# Patient Record
Sex: Female | Born: 2000 | Hispanic: No | Marital: Single | Smoking: Never smoker
Health system: Southern US, Community
[De-identification: ages and names within clinical notes are randomized; demographics above are authoritative.]

## PROBLEM LIST (undated history)

## (undated) DIAGNOSIS — Z789 Other specified health status: Secondary | ICD-10-CM

## (undated) HISTORY — DX: Other specified health status: Z78.9

## (undated) HISTORY — PX: NO PAST SURGERIES: SHX2092

## (undated) HISTORY — PX: OVARIAN CYST SURGERY: SHX726

---

## 2020-09-15 ENCOUNTER — Ambulatory Visit (INDEPENDENT_AMBULATORY_CARE_PROVIDER_SITE_OTHER): Payer: Self-pay

## 2020-09-15 ENCOUNTER — Other Ambulatory Visit: Payer: Self-pay

## 2020-09-15 ENCOUNTER — Ambulatory Visit (HOSPITAL_COMMUNITY)
Admission: EM | Admit: 2020-09-15 | Discharge: 2020-09-15 | Disposition: A | Discharge status: Home / Self Care | Payer: Self-pay | Attending: Internal Medicine | Admitting: Internal Medicine

## 2020-09-15 ENCOUNTER — Encounter (HOSPITAL_COMMUNITY): Payer: Self-pay

## 2020-09-15 ENCOUNTER — Telehealth (HOSPITAL_COMMUNITY): Payer: Self-pay | Admitting: Emergency Medicine

## 2020-09-15 DIAGNOSIS — R059 Cough, unspecified: Secondary | ICD-10-CM

## 2020-09-15 DIAGNOSIS — B349 Viral infection, unspecified: Secondary | ICD-10-CM

## 2020-09-15 DIAGNOSIS — R0602 Shortness of breath: Secondary | ICD-10-CM

## 2020-09-15 DIAGNOSIS — U071 COVID-19: Secondary | ICD-10-CM | POA: Insufficient documentation

## 2020-09-15 DIAGNOSIS — R112 Nausea with vomiting, unspecified: Secondary | ICD-10-CM | POA: Insufficient documentation

## 2020-09-15 LAB — SARS CORONAVIRUS 2 (TAT 6-24 HRS): SARS Coronavirus 2: POSITIVE — AB

## 2020-09-15 MED ORDER — ACETAMINOPHEN 325 MG PO TABS
650.0000 mg | ORAL_TABLET | Freq: Once | ORAL | Status: AC
  Administered 2020-09-15: 650 mg via ORAL

## 2020-09-15 MED ORDER — ONDANSETRON 8 MG PO TBDP: 8.0000 mg | ORAL_TABLET | Freq: Three times a day (TID) | ORAL | 0 refills | Status: DC | PRN

## 2020-09-15 MED ORDER — ACETAMINOPHEN 325 MG PO TABS
ORAL_TABLET | ORAL | Status: AC
  Filled 2020-09-15: qty 2

## 2020-09-15 NOTE — Discharge Instructions (Addendum)
Take medications as prescribed Follow up with PCP if no improvement in 5 to 7 days or return with new or worsening symptoms. Drink plenty of water.

## 2020-09-15 NOTE — ED Triage Notes (Signed)
Pt presents with c/o vomiting x 1 week. Pt states she fainted this morning and states she has been drinking water. Pt states she has body aches x 1 week. Pt states she has mid and lower back pain.  Denies abdominal pain.

## 2020-09-15 NOTE — ED Provider Notes (Signed)
[facility]    CSN: [id number] Arrival date & time: 09/15/20  1359      History   Chief Complaint Chief Complaint  Patient presents with   Fever   Emesis   Near Syncope   Generalized Body Aches    HPI Eileen Johnson is a 20 y.o. female.   Patient here c/w vomiting x 1 week.  Reports 2 episodes in last 48 hours.  Admits f/c, fatigue, nasal congestion, rhinorrhe, cough, denies diarrhea, abdominal pain.  Vomit is non-bilious, non-bloody   History reviewed. No pertinent past medical history.  There are no problems to display for this patient.   History reviewed. No pertinent surgical history.  OB History   No obstetric history on file.      Home Medications    Prior to Admission medications   Medication Sig Start Date End Date Taking? Authorizing Provider  ondansetron (ZOFRAN-ODT) 8 MG disintegrating tablet Take 1 tablet (8 mg total) by mouth every 8 (eight) hours as needed for nausea or vomiting. 09/15/20  Yes Evern Core, PA-C    Family History Family History  Problem Relation Age of Onset   Healthy Mother    Diabetes Father     Social History Social History   Tobacco Use   Smoking status: Never    Passive exposure: Never   Smokeless tobacco: Never  Vaping Use   Vaping Use: Never used  Substance Use Topics   Alcohol use: Never   Drug use: Never     Allergies   Patient has no known allergies.   Review of Systems Review of Systems  Constitutional:  Positive for chills, fatigue and fever.  HENT:  Positive for congestion and rhinorrhea. Negative for ear pain, nosebleeds, postnasal drip, sinus pressure, sinus pain and sore throat.   Eyes:  Negative for pain and redness.  Respiratory:  Positive for cough. Negative for shortness of breath and wheezing.   Gastrointestinal:  Positive for nausea and vomiting. Negative for abdominal pain and diarrhea.  Musculoskeletal:  Negative for arthralgias and myalgias.  Skin:  Negative for  rash.  Neurological:  Positive for light-headedness. Negative for dizziness and headaches.  Hematological:  Negative for adenopathy. Does not bruise/bleed easily.  Psychiatric/Behavioral:  Negative for confusion and sleep disturbance.     Physical Exam Triage Vital Signs ED Triage Vitals  Enc Vitals Group     BP 09/15/20 1447 109/74     Pulse Rate 09/15/20 1447 (!) 115     Resp 09/15/20 1447 19     Temp 09/15/20 1451 (!) 101 F (38.3 C)     Temp Source 09/15/20 1451 Oral     SpO2 09/15/20 1447 96 %     Weight --      Height --      Head Circumference --      Peak Flow --      Pain Score --      Pain Loc --      Pain Edu? --      Excl. in GC? --    No data found.  Updated Vital Signs BP 109/74 (BP Location: Right Arm)   Pulse (!) 115   Temp (!) 101 F (38.3 C) (Oral)   Resp 19   LMP  (LMP Unknown)   SpO2 96%   Visual Acuity Right Eye Distance:   Left Eye Distance:   Bilateral Distance:    Right Eye Near:   Left Eye Near:    Bilateral  Near:     Physical Exam Vitals and nursing note reviewed.  Constitutional:      General: She is not in acute distress.    Appearance: Normal appearance. She is not ill-appearing.  HENT:     Head: Normocephalic and atraumatic.     Mouth/Throat:     Mouth: Mucous membranes are dry.     Pharynx: Oropharynx is clear. No pharyngeal swelling or posterior oropharyngeal erythema.     Tonsils: No tonsillar exudate or tonsillar abscesses.  Eyes:     General: No scleral icterus.    Extraocular Movements: Extraocular movements intact.     Conjunctiva/sclera: Conjunctivae normal.  Cardiovascular:     Rate and Rhythm: Regular rhythm.     Heart sounds: No murmur heard. Pulmonary:     Effort: Pulmonary effort is normal. No respiratory distress.     Breath sounds: Normal breath sounds. No wheezing or rales.  Abdominal:     General: There is no distension.     Tenderness: There is no abdominal tenderness. There is no right CVA  tenderness, left CVA tenderness or guarding.  Musculoskeletal:     Cervical back: Normal range of motion. No rigidity.  Skin:    Coloration: Skin is not jaundiced.     Findings: No rash.  Neurological:     General: No focal deficit present.     Mental Status: She is alert and oriented to person, place, and time.     Motor: No weakness.     Gait: Gait normal.  Psychiatric:        Mood and Affect: Mood normal.        Behavior: Behavior normal.     UC Treatments / Results  Labs (all labs ordered are listed, but only abnormal results are displayed) Labs Reviewed  SARS CORONAVIRUS 2 (TAT 6-24 HRS)    EKG   Radiology DG Chest 2 View  Result Date: 09/15/2020 CLINICAL DATA:  Shortness of breath and cough EXAM: CHEST - 2 VIEW COMPARISON:  None. FINDINGS: The heart size and mediastinal contours are within normal limits. Both lungs are clear. The visualized skeletal structures are unremarkable. IMPRESSION: No active cardiopulmonary disease. Electronically Signed   By: Alcide Clever M.D.   On: 09/15/2020 16:07    Procedures Procedures (including critical care time)  Medications Ordered in UC Medications  acetaminophen (TYLENOL) tablet 650 mg (650 mg Oral Given 09/15/20 1454)    Initial Impression / Assessment and Plan / UC Course  I have reviewed the triage vital signs and the nursing notes.  Pertinent labs & imaging results that were available during my care of the patient were reviewed by me and considered in my medical decision making (see chart for details).     Declined IV hydration Drink plenty of liquids Strict ED precautions provided Take medication as prescribed Final Clinical Impressions(s) / UC Diagnoses   Final diagnoses:  Non-intractable vomiting with nausea, unspecified vomiting type  Viral illness     Discharge Instructions      Take medications as prescribed Follow up with PCP if no improvement in 5 to 7 days or return with new or worsening  symptoms. Drink plenty of water.       ED Prescriptions     Medication Sig Dispense Auth. Provider   ondansetron (ZOFRAN-ODT) 8 MG disintegrating tablet Take 1 tablet (8 mg total) by mouth every 8 (eight) hours as needed for nausea or vomiting. 20 tablet Evern Core, PA-C  PDMP not reviewed this encounter.   Evern Core, PA-C 09/15/20 1642

## 2021-05-08 ENCOUNTER — Other Ambulatory Visit: Payer: Self-pay

## 2021-05-08 ENCOUNTER — Encounter (HOSPITAL_COMMUNITY): Payer: Self-pay | Admitting: *Deleted

## 2021-05-08 ENCOUNTER — Encounter (HOSPITAL_COMMUNITY): Payer: Self-pay | Admitting: Emergency Medicine

## 2021-05-08 ENCOUNTER — Emergency Department (HOSPITAL_COMMUNITY)
Admission: EM | Admit: 2021-05-08 | Discharge: 2021-05-08 | Disposition: A | Discharge status: Home / Self Care | Payer: Self-pay | Attending: Emergency Medicine | Admitting: Emergency Medicine

## 2021-05-08 ENCOUNTER — Ambulatory Visit (HOSPITAL_COMMUNITY)
Admission: EM | Admit: 2021-05-08 | Discharge: 2021-05-08 | Disposition: A | Discharge status: Home / Self Care | Payer: Self-pay

## 2021-05-08 ENCOUNTER — Emergency Department (HOSPITAL_COMMUNITY): Payer: Self-pay

## 2021-05-08 DIAGNOSIS — R059 Cough, unspecified: Secondary | ICD-10-CM | POA: Insufficient documentation

## 2021-05-08 DIAGNOSIS — Z5321 Procedure and treatment not carried out due to patient leaving prior to being seen by health care provider: Secondary | ICD-10-CM | POA: Insufficient documentation

## 2021-05-08 DIAGNOSIS — J069 Acute upper respiratory infection, unspecified: Secondary | ICD-10-CM

## 2021-05-08 DIAGNOSIS — M791 Myalgia, unspecified site: Secondary | ICD-10-CM | POA: Insufficient documentation

## 2021-05-08 DIAGNOSIS — Z20822 Contact with and (suspected) exposure to covid-19: Secondary | ICD-10-CM | POA: Insufficient documentation

## 2021-05-08 LAB — RESP PANEL BY RT-PCR (FLU A&B, COVID) ARPGX2
Influenza A by PCR: NEGATIVE
Influenza B by PCR: NEGATIVE
SARS Coronavirus 2 by RT PCR: NEGATIVE

## 2021-05-08 NOTE — ED Provider Triage Note (Signed)
Emergency Medicine Provider Triage Evaluation Note ? ?Eileen Johnson , a 21 y.o. female  was evaluated in triage.  Pt complains of difficulty breathing, cough, and bodyaches.  Denies fever or sick contacts. ? ?Review of Systems  ?Positive: Cough, bodyaches ?Negative: fever ? ?Physical Exam  ?BP 103/82   Pulse (!) 102   Temp 98.3 ?F (36.8 ?C)   Resp (!) 22   SpO2 99%  ?Gen:   Awake, no distress   ?Resp:  Normal effort  ?MSK:   Moves extremities without difficulty  ?Other:  Tearful, appears anxious ? ?Medical Decision Making  ?Medically screening exam initiated at 12:33 AM.  Appropriate orders placed.  Eileen Johnson was informed that the remainder of the evaluation will be completed by another provider, this initial triage assessment does not replace that evaluation, and the importance of remaining in the ED until their evaluation is complete. ? ?Cough, bodyaches difficulty breathing x1 week.  CXR, covid screen. ?  ?Garlon Hatchet, PA-C ?05/08/21 6834 ? ?

## 2021-05-08 NOTE — ED Triage Notes (Signed)
Reports cough, congestion for one week. Patient has had nausea, dizziness, body aches and sob.   ?

## 2021-05-08 NOTE — ED Notes (Signed)
Patient left on own accord °

## 2021-05-08 NOTE — ED Triage Notes (Signed)
The pt speaks little english  shes spanish speakinh she has body aches  and a cough for one week  lmp march 14th ?

## 2021-05-08 NOTE — Discharge Instructions (Addendum)
Your symptoms and exam findings are most consistent with a viral upper respiratory infection. These usually run their course in about 10 days.  If your symptoms last longer than 10 days without improvement, please follow up with your primary care provider.  If your symptoms, worsen, please go to the Emergency Room.   ? ?Your tests for COVID-19 and influenza today in the ED were negative. ? ?Some things that can make you feel better are: ?- Increased rest ?- Increasing fluid with water/sugar free electrolytes ?- Acetaminophen and ibuprofen as needed for fever/pain.  ?- Salt water gargling, chloraseptic spray and throat lozenges ?- OTC guaifenesin (Mucinex).  ?- Saline sinus flushes or a neti pot.  ?- Humidifying the air.  ?

## 2021-05-08 NOTE — ED Provider Notes (Signed)
?[facility] ? ? ? ?CSN: [id number] ?Arrival date & time: 05/08/21  1427 ? ? ?  ? ?History   ?Chief Complaint ?Chief Complaint  ?Patient presents with  ? Cough  ? ? ?HPI ?Eileen Johnson is a 21 y.o. female.  ? ?Patient presents with Spanish medical interpreter, [facility], [facility], [facility].  Patient reports feeling bad for the past week, since last Saturday.  She reports body aches, cough, congestion, runny nose, sore throat, headache, sinus pressure.  She denies nausea/vomiting, change in appetite, diarrhea, post nasal drainage, and sneezing/itchy nose. She has not taken anything for her symptoms.  ? ? ?She was seen in the Emergency Room today where a rapid COVID and influenza test were done that were negative.  ? ? ?History reviewed. No pertinent past medical history. ? ?There are no problems to display for this patient. ? ? ?History reviewed. No pertinent surgical history. ? ?OB History   ?No obstetric history on file. ?  ? ? ? ?Home Medications   ? ?Prior to Admission medications   ?Not on File  ? ? ?Family History ?Family History  ?Problem Relation Age of Onset  ? Healthy Mother   ? Diabetes Father   ? ? ?Social History ?Social History  ? ?Tobacco Use  ? Smoking status: Never  ?  Passive exposure: Never  ? Smokeless tobacco: Never  ?Vaping Use  ? Vaping Use: Never used  ?Substance Use Topics  ? Alcohol use: Never  ? Drug use: Never  ? ? ? ?Allergies   ?Patient has no known allergies. ? ? ?Review of Systems ?Review of Systems ?Per HPI ? ?Physical Exam ?Triage Vital Signs ?ED Triage Vitals  ?Enc Vitals Group  ?   BP 05/08/21 1628 119/70  ?   Pulse Rate 05/08/21 1628 77  ?   Resp 05/08/21 1628 18  ?   Temp 05/08/21 1628 98.8 ?F (37.1 ?C)  ?   Temp Source 05/08/21 1628 Oral  ?   SpO2 05/08/21 1628 100 %  ?   Weight --   ?   Height --   ?   Head Circumference --   ?   Peak Flow --   ?   Pain Score 05/08/21 1624 7  ?   Pain Loc --   ?   Pain Edu? --   ?   Excl. in GC? --   ? ?No data found. ? ?Updated Vital Signs ?BP 119/70  (BP Location: Right Arm)   Pulse 77   Temp 98.8 ?F (37.1 ?C) (Oral)   Resp 18   LMP 03/30/2021 (Approximate)   SpO2 100%  ? ?Visual Acuity ?Right Eye Distance:   ?Left Eye Distance:   ?Bilateral Distance:   ? ?Right Eye Near:   ?Left Eye Near:    ?Bilateral Near:    ? ?Physical Exam ?Vitals and nursing note reviewed.  ?Constitutional:   ?   General: She is not in acute distress. ?   Appearance: She is well-developed. She is not ill-appearing or toxic-appearing.  ?HENT:  ?   Head: Normocephalic and atraumatic.  ?   Right Ear: Tympanic membrane and ear canal normal. No drainage or swelling. No middle ear effusion. Tympanic membrane is not erythematous.  ?   Left Ear: Tympanic membrane and ear canal normal. No drainage, swelling or tenderness.  No middle ear effusion. Tympanic membrane is not erythematous.  ?   Nose: No congestion or rhinorrhea.  ?   Mouth/Throat:  ?  Mouth: Mucous membranes are moist.  ?   Pharynx: Oropharynx is clear. Uvula midline. No oropharyngeal exudate or posterior oropharyngeal erythema.  ?   Tonsils: Tonsillar exudate present. 2+ on the right. 2+ on the left.  ?Eyes:  ?   Extraocular Movements:  ?   Right eye: Normal extraocular motion.  ?   Left eye: Normal extraocular motion.  ?Cardiovascular:  ?   Rate and Rhythm: Normal rate and regular rhythm.  ?Pulmonary:  ?   Effort: Pulmonary effort is normal. No respiratory distress.  ?   Breath sounds: Normal breath sounds. No wheezing, rhonchi or rales.  ?Abdominal:  ?   General: Abdomen is flat. Bowel sounds are normal. There is no distension.  ?   Palpations: Abdomen is soft.  ?Musculoskeletal:  ?   Cervical back: Normal range of motion and neck supple.  ?Lymphadenopathy:  ?   Cervical: No cervical adenopathy.  ?Skin: ?   General: Skin is warm and dry.  ?   Capillary Refill: Capillary refill takes less than 2 seconds.  ?   Coloration: Skin is not pale.  ?   Findings: No erythema or rash.  ?Neurological:  ?   Mental Status: She is alert and  oriented to person, place, and time.  ?Psychiatric:     ?   Behavior: Behavior is cooperative.  ? ? ? ?UC Treatments / Results  ?Labs ?(all labs ordered are listed, but only abnormal results are displayed) ?Labs Reviewed - No data to display ? ?EKG ? ? ?Radiology ?DG Chest 2 View ? ?Result Date: 05/08/2021 ?CLINICAL DATA:  Cough, body aches EXAM: CHEST - 2 VIEW COMPARISON:  09/15/2020 FINDINGS: The heart size and mediastinal contours are within normal limits. Both lungs are clear. The visualized skeletal structures are unremarkable. IMPRESSION: Normal study Electronically Signed   By: Charlett NoseKevin  Dover M.D.   On: 05/08/2021 01:22   ? ?Procedures ?Procedures (including critical care time) ? ?Medications Ordered in UC ?Medications - No data to display ? ?Initial Impression / Assessment and Plan / UC Course  ?I have reviewed the triage vital signs and the nursing notes. ? ?Pertinent labs & imaging results that were available during my care of the patient were reviewed by me and considered in my medical decision making (see chart for details). ? ?Reassured patient that symptoms and exam findings are most consistent with a viral upper respiratory infection and explained lack of efficacy of antibiotics against viruses.  Discussed expected course and features suggestive of secondary bacterial infection.  On examination, she does not appear ill and I do not hear her coughing at all.  COVID and influenza testing were negative in the ED today-I do not see a reason to repeat these test. ? ?Continue supportive care. Increase fluid intake with water or electrolyte solution like pedialyte. Encouraged acetaminophen as needed for fever/pain. Encouraged salt water gargling, chloraseptic spray and throat lozenges. Encouraged OTC guaifenesin. Encouraged saline sinus flushes and/or neti with humidified air.  Seek care if symptoms persist more than 10 days without improvement. Note given for work. ? ? ?Final Clinical Impressions(s) / UC  Diagnoses  ? ?Final diagnoses:  ?Viral URI with cough  ? ? ? ?Discharge Instructions   ? ?  ?Your symptoms and exam findings are most consistent with a viral upper respiratory infection. These usually run their course in about 10 days.  If your symptoms last longer than 10 days without improvement, please follow up with your primary care provider.  If  your symptoms, worsen, please go to the Emergency Room.   ? ?Your tests for COVID-19 and influenza today in the ED were negative. ? ?Some things that can make you feel better are: ?- Increased rest ?- Increasing fluid with water/sugar free electrolytes ?- Acetaminophen and ibuprofen as needed for fever/pain.  ?- Salt water gargling, chloraseptic spray and throat lozenges ?- OTC guaifenesin (Mucinex).  ?- Saline sinus flushes or a neti pot.  ?- Humidifying the air.  ? ? ? ? ?ED Prescriptions   ?None ?  ? ?PDMP not reviewed this encounter. ?  ?Valentino Nose, NP ?05/08/21 1654 ? ?

## 2021-08-25 ENCOUNTER — Encounter (HOSPITAL_COMMUNITY): Payer: Self-pay | Admitting: Obstetrics and Gynecology

## 2021-08-25 ENCOUNTER — Inpatient Hospital Stay (HOSPITAL_COMMUNITY): Payer: Medicaid Other

## 2021-08-25 ENCOUNTER — Ambulatory Visit
Admission: EM | Admit: 2021-08-25 | Discharge: 2021-08-25 | Payer: Medicaid Other | Attending: Internal Medicine | Admitting: Internal Medicine

## 2021-08-25 ENCOUNTER — Inpatient Hospital Stay (HOSPITAL_COMMUNITY)
Admission: AD | Admit: 2021-08-25 | Discharge: 2021-08-25 | Disposition: A | Discharge status: Home / Self Care | Payer: Medicaid Other | Attending: Obstetrics and Gynecology | Admitting: Obstetrics and Gynecology

## 2021-08-25 DIAGNOSIS — Z6790 Unspecified blood type, Rh positive: Secondary | ICD-10-CM | POA: Diagnosis not present

## 2021-08-25 DIAGNOSIS — Z3201 Encounter for pregnancy test, result positive: Secondary | ICD-10-CM | POA: Diagnosis not present

## 2021-08-25 DIAGNOSIS — Z3491 Encounter for supervision of normal pregnancy, unspecified, first trimester: Secondary | ICD-10-CM

## 2021-08-25 DIAGNOSIS — N939 Abnormal uterine and vaginal bleeding, unspecified: Secondary | ICD-10-CM

## 2021-08-25 DIAGNOSIS — O209 Hemorrhage in early pregnancy, unspecified: Secondary | ICD-10-CM | POA: Insufficient documentation

## 2021-08-25 DIAGNOSIS — Z3A09 9 weeks gestation of pregnancy: Secondary | ICD-10-CM | POA: Diagnosis not present

## 2021-08-25 LAB — CBC
HCT: 38 % (ref 36.0–46.0)
Hemoglobin: 12.9 g/dL (ref 12.0–15.0)
MCH: 30.5 pg (ref 26.0–34.0)
MCHC: 33.9 g/dL (ref 30.0–36.0)
MCV: 89.8 fL (ref 80.0–100.0)
Platelets: 274 10*3/uL (ref 150–400)
RBC: 4.23 MIL/uL (ref 3.87–5.11)
RDW: 12.5 % (ref 11.5–15.5)
WBC: 14 10*3/uL — ABNORMAL HIGH (ref 4.0–10.5)
nRBC: 0 % (ref 0.0–0.2)

## 2021-08-25 LAB — WET PREP, GENITAL
Sperm: NONE SEEN
Trich, Wet Prep: NONE SEEN
WBC, Wet Prep HPF POC: <10 (ref <10)
Yeast Wet Prep HPF POC: NONE SEEN

## 2021-08-25 LAB — POCT URINE PREGNANCY: Preg Test, Ur: POSITIVE — AB

## 2021-08-25 LAB — ABO/RH: ABO/RH(D): O POS

## 2021-08-25 LAB — HCG, QUANTITATIVE, PREGNANCY: hCG, Beta Chain, Quant, S: 93057 m[IU]/mL — ABNORMAL HIGH (ref <5)

## 2021-08-25 NOTE — ED Provider Notes (Signed)
[facility]    CSN: [id number] Arrival date & time: 08/25/21  1629      History   Chief Complaint Chief Complaint  Patient presents with   Possible Pregnancy   Vaginal Bleeding    HPI Eileen Johnson is a 21 y.o. female.   Patient presents with vaginal bleeding that started around 3 PM today.  Patient reports that she is currently pregnant.  Last menstrual cycle was 05/24/2021.  She has been seen by OB/GYN and has not had any complications until today with pregnancy.  Patient reports heavy vaginal bleeding.  She has had to change her menstrual pad twice since bleeding started.  Denies any associated abdominal pain.   Possible Pregnancy  Vaginal Bleeding   History reviewed. No pertinent past medical history.  There are no problems to display for this patient.   History reviewed. No pertinent surgical history.  OB History     Gravida  1   Para      Term      Preterm      AB      Living         SAB      IAB      Ectopic      Multiple      Live Births               Home Medications    Prior to Admission medications   Not on File    Family History Family History  Problem Relation Age of Onset   Healthy Mother    Diabetes Father     Social History Social History   Tobacco Use   Smoking status: Never    Passive exposure: Never   Smokeless tobacco: Never  Vaping Use   Vaping Use: Never used  Substance Use Topics   Alcohol use: Never   Drug use: Never     Allergies   Patient has no known allergies.   Review of Systems Review of Systems Per HPI  Physical Exam Triage Vital Signs ED Triage Vitals  Enc Vitals Group     BP 08/25/21 1641 101/64     Pulse Rate 08/25/21 1641 (!) 101     Resp 08/25/21 1641 18     Temp 08/25/21 1641 98.9 F (37.2 C)     Temp Source 08/25/21 1641 Oral     SpO2 08/25/21 1641 98 %     Weight --      Height --      Head Circumference --      Peak Flow --      Pain Score  08/25/21 1647 0     Pain Loc --      Pain Edu? --      Excl. in GC? --    No data found.  Updated Vital Signs BP 101/64 (BP Location: Left Arm)   Pulse (!) 101   Temp 98.9 F (37.2 C) (Oral)   Resp 18   LMP 06/13/2021 (Exact Date)   SpO2 98%   Visual Acuity Right Eye Distance:   Left Eye Distance:   Bilateral Distance:    Right Eye Near:   Left Eye Near:    Bilateral Near:     Physical Exam Constitutional:      General: She is not in acute distress.    Appearance: Normal appearance. She is not toxic-appearing or diaphoretic.  HENT:     Head: Normocephalic and atraumatic.  Eyes:  Extraocular Movements: Extraocular movements intact.     Conjunctiva/sclera: Conjunctivae normal.  Pulmonary:     Effort: Pulmonary effort is normal.  Neurological:     General: No focal deficit present.     Mental Status: She is alert and oriented to person, place, and time. Mental status is at baseline.  Psychiatric:        Mood and Affect: Mood normal.        Behavior: Behavior normal.        Thought Content: Thought content normal.        Judgment: Judgment normal.      UC Treatments / Results  Labs (all labs ordered are listed, but only abnormal results are displayed) Labs Reviewed  POCT URINE PREGNANCY - Abnormal; Notable for the following components:      Result Value   Preg Test, Ur Positive (*)    All other components within normal limits    EKG   Radiology No results found.  Procedures Procedures (including critical care time)  Medications Ordered in UC Medications - No data to display  Initial Impression / Assessment and Plan / UC Course  I have reviewed the triage vital signs and the nursing notes.  Pertinent labs & imaging results that were available during my care of the patient were reviewed by me and considered in my medical decision making (see chart for details).     Urine pregnancy test was positive today.  Advised patient that she will need to  go to the maternity assessment unit at [facility] as soon as possible for further evaluation and management of abnormal vaginal bleeding in pregnancy.  Patient was agreeable with plan.  Vital signs stable at discharge.  Agree with patient self transport to the hospital.  Interpreter used throughout patient interaction. Final Clinical Impressions(s) / UC Diagnoses   Final diagnoses:  Positive urine pregnancy test  Abnormal vaginal bleeding     Discharge Instructions      Please go to the maternity assessment unit at [facility] as soon as possible for further evaluation and management.    ED Prescriptions   None    PDMP not reviewed this encounter.   Gustavus Bryant, [place] 08/25/21 1731

## 2021-08-25 NOTE — MAU Provider Note (Signed)
Chief Complaint: Vaginal Bleeding   Event Date/Time   First Provider Initiated Contact with Patient 08/25/21 2332      SUBJECTIVE HPI: Eileen Johnson is a 21 y.o. G1P0 at [redacted]w[redacted]d by LMP who presents to maternity admissions reporting onset of bright red bleeding today, not enough to soak a pad but with clots and enough to wear a pad.  The bleeding then slowed and she did not see any bleeding the last time she went to the bathroom.  There is no pain.   HPI  No past medical history on file. No past surgical history on file. Social History   Socioeconomic History   Marital status: Single    Spouse name: Not on file   Number of children: Not on file   Years of education: Not on file   Highest education level: Not on file  Occupational History   Not on file  Tobacco Use   Smoking status: Never    Passive exposure: Never   Smokeless tobacco: Never  Vaping Use   Vaping Use: Never used  Substance and Sexual Activity   Alcohol use: Never   Drug use: Never   Sexual activity: Yes    Birth control/protection: None  Other Topics Concern   Not on file  Social History Narrative   Not on file   Social Determinants of Health   Financial Resource Strain: Not on file  Food Insecurity: Not on file  Transportation Needs: Not on file  Physical Activity: Not on file  Stress: Not on file  Social Connections: Not on file  Intimate Partner Violence: Not on file   No current facility-administered medications on file prior to encounter.   No current outpatient medications on file prior to encounter.   No Known Allergies  ROS:  Review of Systems  Constitutional:  Negative for chills, fatigue and fever.  Respiratory:  Negative for shortness of breath.   Cardiovascular:  Negative for chest pain.  Gastrointestinal:  Negative for nausea and vomiting.  Genitourinary:  Positive for vaginal bleeding. Negative for difficulty urinating, dysuria, flank pain, pelvic pain, vaginal discharge  and vaginal pain.  Neurological:  Negative for dizziness and headaches.  Psychiatric/Behavioral: Negative.       I have reviewed patient's Past Medical Hx, Surgical Hx, Family Hx, Social Hx, medications and allergies.   Physical Exam  Patient Vitals for the past 24 hrs:  BP Temp Temp src Pulse Resp SpO2 Height Weight  08/25/21 2350 111/66 -- -- -- -- -- -- --  08/25/21 2012 115/74 97.8 F (36.6 C) Oral 95 17 99 % 5\' 2"  (1.575 m) 66.2 kg   Constitutional: Well-developed, well-nourished female in no acute distress.  Cardiovascular: normal rate Respiratory: normal effort GI: Abd soft, non-tender. Pos BS x 4 MS: Extremities nontender, no edema, normal ROM Neurologic: Alert and oriented x 4.  GU: Neg CVAT.  PELVIC EXAM:Vaginal cultures by blind swab   LAB RESULTS Results for orders placed or performed during the hospital encounter of 08/25/21 (from the past 24 hour(s))  CBC     Status: Abnormal   Collection Time: 08/25/21  6:55 PM  Result Value Ref Range   WBC 14.0 (H) 4.0 - 10.5 K/uL   RBC 4.23 3.87 - 5.11 MIL/uL   Hemoglobin 12.9 12.0 - 15.0 g/dL   HCT 10/25/21 02.5 - 42.7 %   MCV 89.8 80.0 - 100.0 fL   MCH 30.5 26.0 - 34.0 pg   MCHC 33.9 30.0 - 36.0 g/dL  RDW 12.5 11.5 - 15.5 %   Platelets 274 150 - 400 K/uL   nRBC 0.0 0.0 - 0.2 %  hCG, quantitative, pregnancy     Status: Abnormal   Collection Time: 08/25/21  6:55 PM  Result Value Ref Range   hCG, Beta Chain, Quant, S 93,057 (H) <5 mIU/mL  ABO/Rh     Status: None   Collection Time: 08/25/21  6:55 PM  Result Value Ref Range   ABO/RH(D)      O POS Performed at [facility], [address], [place], [place] [address]   Wet prep, genital     Status: Abnormal   Collection Time: 08/25/21  8:39 PM  Result Value Ref Range   Yeast Wet Prep HPF POC NONE SEEN NONE SEEN   Trich, Wet Prep NONE SEEN NONE SEEN   Clue Cells Wet Prep HPF POC PRESENT (A) NONE SEEN   WBC, Wet Prep HPF POC <10 <10   Sperm NONE SEEN      --/--/O POS Performed at [facility], [address] [place], [place] [address]  [phone])  IMAGING US OB Comp Less 14 Wks  Result Date: 08/25/2021 CLINICAL DATA:  Vaginal bleeding EXAM: OBSTETRIC <14 WK ULTRASOUND TECHNIQUE: Transabdominal ultrasound was performed for evaluation of the gestation as well as the maternal uterus and adnexal regions. COMPARISON:  None Available. FINDINGS: Intrauterine gestational sac: Single Yolk sac:  Seen Embryo:  Seen Cardiac Activity: Seen Heart Rate: 187 bpm CRL:   28.4 mm   9 w 4 d                  [place] EDC: 03/26/2022 Subchorionic hemorrhage:  None visualized. Maternal uterus/adnexae: No significant abnormalities are seen. IMPRESSION: Single live intrauterine pregnancy is seen. Sonographically estimated gestational age is 9 weeks 4 days. Electronically Signed   By: Ernie Avena M.D.   On: 08/25/2021 20:35    MAU Management/MDM: Orders Placed This Encounter  Procedures   Wet prep, genital   US OB Comp Less 14 Wks   CBC   hCG, quantitative, pregnancy   Urinalysis, Routine w reflex microscopic   ABO/Rh   Discharge patient    No orders of the defined types were placed in this encounter.   IUP on today's [place], no evidence of subchorionic hemorrhage.  Clue cells on wet prep but no odor or other criteria for BV.  Pt encouraged to start prenatal care as soon as possible, list of providers given.  Bleeding precautions/reasons to return to MAU reviewed.    ASSESSMENT 1. Vaginal bleeding in pregnancy, first trimester   2. Normal IUP (intrauterine pregnancy) on prenatal ultrasound, first trimester   3. Blood type, Rh positive     PLAN Discharge home Allergies as of 08/25/2021   No Known Allergies      Medication List    You have not been prescribed any medications.     Follow-up Information     Prenatal provider of your choice Follow up.   Why: Start prenatal care as soon as possible. See list provided.                 Sharen Counter Certified Nurse-Midwife 08/25/2021  11:53 PM

## 2021-08-25 NOTE — MAU Note (Signed)
..  Eileen Johnson is a 21 y.o. at Unknown here in MAU reporting: Around 3pm she had an episode of vaginal bleeding with clots, now she is having less bleeding. Has not had to change her pad since 3pm. Denies abdominal pain.  Last intercourse: weeks ago LMP: 06/13/2021 Onset of complaint: today at 3pm Pain score: 0/10 Vitals:   08/25/21 2012  BP: 115/74  Pulse: 95  Resp: 17  Temp: 97.8 F (36.6 C)  SpO2: 99%      Lab orders placed from triage:  none

## 2021-08-25 NOTE — ED Triage Notes (Signed)
Pt presents with possible pregnancy and vaginal bleeding that started today.

## 2021-08-25 NOTE — ED Notes (Signed)
Patient is being discharged from the Urgent Care and sent to the Emergency Department via personal vehicle with family . Per Provider [facility], patient is in need of higher level of care due to vaginal bleeding in pregnancy. Patient is aware and verbalizes understanding of plan of care.   Vitals:   08/25/21 1641  BP: 101/64  Pulse: (!) 101  Resp: 18  Temp: 98.9 F (37.2 C)  SpO2: 98%

## 2021-08-25 NOTE — Discharge Instructions (Signed)
[facility] [facility] Providers   [facility]             [address], [place], [place] [address] [phone]  [facility]                                                             [address], [address], [place], [place], [address] [phone]  [facility]                                    [address], [place], [place], [address] [phone]  [facility] [place] [address], [place], [place], [address] [phone]  [facility]                                 [address], [place], [place], [address] [phone]  [facility]                                    [address], [place], [place], [address] [phone]  [facility] [address], [place], [place], [address]                              [facility] [address], [place], [place], [address] [phone]  [facility]         Phone: [phone]  [facility]      Phone: [phone]   [facility]      Phone: [phone]  [facility]         Phone: [phone]   [facility]    Phone: [phone]  [facility]      Phone: [phone]  [facility]     Phone: [phone]  [facility]        Phone: [phone]  [facility] (Sheronette Cousins) [phone]  [facility]      Phone: [phone]   

## 2021-08-25 NOTE — Discharge Instructions (Signed)
Please go to the maternity assessment unit at [facility] as soon as possible for further evaluation and management.

## 2021-08-26 LAB — GC/CHLAMYDIA PROBE AMP (~~LOC~~) NOT AT ARMC
Chlamydia: POSITIVE — AB
Comment: NEGATIVE
Comment: NORMAL
Neisseria Gonorrhea: NEGATIVE

## 2021-08-26 LAB — URINALYSIS, ROUTINE W REFLEX MICROSCOPIC
Bilirubin Urine: NEGATIVE
Glucose, UA: NEGATIVE mg/dL
Ketones, ur: NEGATIVE mg/dL
Leukocytes,Ua: NEGATIVE
Nitrite: NEGATIVE
Protein, ur: NEGATIVE mg/dL
Specific Gravity, Urine: 1.026 (ref 1.005–1.030)
pH: 6 (ref 5.0–8.0)

## 2021-09-08 ENCOUNTER — Encounter: Payer: Self-pay | Admitting: Emergency Medicine

## 2021-10-02 ENCOUNTER — Inpatient Hospital Stay (HOSPITAL_BASED_OUTPATIENT_CLINIC_OR_DEPARTMENT_OTHER): Payer: Medicaid Other

## 2021-10-02 ENCOUNTER — Inpatient Hospital Stay: Payer: Medicaid Other

## 2021-10-02 ENCOUNTER — Encounter (HOSPITAL_COMMUNITY): Payer: Self-pay | Admitting: Obstetrics and Gynecology

## 2021-10-02 ENCOUNTER — Other Ambulatory Visit: Payer: Self-pay

## 2021-10-02 ENCOUNTER — Inpatient Hospital Stay (HOSPITAL_COMMUNITY)
Admission: AD | Admit: 2021-10-02 | Discharge: 2021-10-02 | Disposition: A | Discharge status: Home / Self Care | Payer: Medicaid Other | Attending: Obstetrics and Gynecology | Admitting: Obstetrics and Gynecology

## 2021-10-02 DIAGNOSIS — O98812 Other maternal infectious and parasitic diseases complicating pregnancy, second trimester: Secondary | ICD-10-CM | POA: Diagnosis not present

## 2021-10-02 DIAGNOSIS — O26892 Other specified pregnancy related conditions, second trimester: Secondary | ICD-10-CM

## 2021-10-02 DIAGNOSIS — R102 Pelvic and perineal pain: Secondary | ICD-10-CM

## 2021-10-02 DIAGNOSIS — Z3A15 15 weeks gestation of pregnancy: Secondary | ICD-10-CM | POA: Insufficient documentation

## 2021-10-02 DIAGNOSIS — A749 Chlamydial infection, unspecified: Secondary | ICD-10-CM | POA: Insufficient documentation

## 2021-10-02 LAB — URINALYSIS, ROUTINE W REFLEX MICROSCOPIC
Bilirubin Urine: NEGATIVE
Glucose, UA: NEGATIVE mg/dL
Hgb urine dipstick: NEGATIVE
Ketones, ur: NEGATIVE mg/dL
Nitrite: NEGATIVE
Protein, ur: NEGATIVE mg/dL
Specific Gravity, Urine: 1.018 (ref 1.005–1.030)
pH: 6 (ref 5.0–8.0)

## 2021-10-02 LAB — WET PREP, GENITAL
Clue Cells Wet Prep HPF POC: NONE SEEN
Sperm: NONE SEEN
Trich, Wet Prep: NONE SEEN
WBC, Wet Prep HPF POC: >=10 — AB (ref <10)
Yeast Wet Prep HPF POC: NONE SEEN

## 2021-10-02 MED ORDER — AZITHROMYCIN 250 MG PO TABS
1000.0000 mg | ORAL_TABLET | Freq: Once | ORAL | Status: AC
  Administered 2021-10-02: 1000 mg via ORAL
  Filled 2021-10-02: qty 4

## 2021-10-02 NOTE — MAU Provider Note (Signed)
History     [id number]  Arrival date and time: 10/02/21 1502    Chief Complaint  Patient presents with   Abdominal Pain     HPI Eileen Johnson is a 21 y.o. at [redacted]w[redacted]d who presents for abdominal pain. Symptoms started 3 days ago. Reports constant lower abdominal pain that is worse with sitting & standing up. Also has worsening lower abdominal cramping when she voids.  Denies fever, n/v/d, dysuria, hematuria, vaginal bleeding, or vaginal discharge.  States she saw that her test last month was positive for chlamydia but didn't receive treatment. Has continued to be sexually active with same partner.    OB History     Gravida  1   Para      Term      Preterm      AB      Living         SAB      IAB      Ectopic      Multiple      Live Births              History reviewed. No pertinent past medical history.  History reviewed. No pertinent surgical history.  Family History  Problem Relation Age of Onset   Healthy Mother    Diabetes Father     No Known Allergies  No current facility-administered medications on file prior to encounter.   No current outpatient medications on file prior to encounter.     ROS Pertinent positives and negative per HPI, all others reviewed and negative  Physical Exam   BP 113/64   Pulse 82   Temp 98.7 F (37.1 C) (Oral)   Resp 16   Ht 5' 1.42" (1.56 m)   Wt 66 kg   LMP 06/13/2021 (Exact Date)   SpO2 98%   BMI 27.12 kg/m   Patient Vitals for the past 24 hrs:  BP Temp Temp src Pulse Resp SpO2 Height Weight  10/02/21 1728 113/64 -- -- 82 -- -- -- --  10/02/21 1528 119/69 98.7 F (37.1 C) Oral 83 16 98 % -- --  10/02/21 1523 -- -- -- -- -- -- 5' 1.42" (1.56 m) 66 kg    Physical Exam Vitals and nursing note reviewed. Exam conducted with a chaperone present.  Constitutional:      General: She is not in acute distress.    Appearance: She is well-developed.  HENT:     Head: Normocephalic and atraumatic.   Pulmonary:     Effort: Pulmonary effort is normal. No respiratory distress.  Abdominal:     Palpations: Abdomen is soft.     Tenderness: There is no abdominal tenderness.  Genitourinary:    Comments: Cervix closed Skin:    General: Skin is warm and dry.  Neurological:     Mental Status: She is alert.        Labs Results for orders placed or performed during the hospital encounter of 10/02/21 (from the past 24 hour(s))  Urinalysis, Routine w reflex microscopic Urine, Clean Catch     Status: Abnormal   Collection Time: 10/02/21  3:28 PM  Result Value Ref Range   Color, Urine YELLOW YELLOW   APPearance CLEAR CLEAR   Specific Gravity, Urine 1.018 1.005 - 1.030   pH 6.0 5.0 - 8.0   Glucose, UA NEGATIVE NEGATIVE mg/dL   Hgb urine dipstick NEGATIVE NEGATIVE   Bilirubin Urine NEGATIVE NEGATIVE   Ketones, ur NEGATIVE NEGATIVE mg/dL  Protein, ur NEGATIVE NEGATIVE mg/dL   Nitrite NEGATIVE NEGATIVE   Leukocytes,Ua TRACE (A) NEGATIVE   RBC / HPF 0-5 0 - 5 RBC/hpf   WBC, UA 0-5 0 - 5 WBC/hpf   Bacteria, UA RARE (A) NONE SEEN   Squamous Epithelial / LPF 0-5 0 - 5   Mucus PRESENT   Wet prep, genital     Status: Abnormal   Collection Time: 10/02/21  4:04 PM  Result Value Ref Range   Yeast Wet Prep HPF POC NONE SEEN NONE SEEN   Trich, Wet Prep NONE SEEN NONE SEEN   Clue Cells Wet Prep HPF POC NONE SEEN NONE SEEN   WBC, Wet Prep HPF POC >=10 (A) <10   Sperm NONE SEEN     Imaging [place] MFM OB LIMITED  Result Date: 10/02/2021 ----------------------------------------------------------------------  OBSTETRICS REPORT                       (Signed Final 10/02/2021 05:10 pm) ---------------------------------------------------------------------- Patient Info  ID #:       [id number]                          D.O.B.:  10-24-2000 (20 yrs)  Name:       Eileen Johnson            Visit Date: 10/02/2021 04:41 pm ---------------------------------------------------------------------- Performed By   Attending:        Johnell Comings MD         Referred By:       [facility] MAU/Triage  Performed By:     Rodrigo Ran BS      [facility]                                  [place] ---------------------------------------------------------------------- Orders  #  Description                           Code        Ordered By  1  [place] MFM OB LIMITED                     58850.27    Jorje Guild ----------------------------------------------------------------------  #  Order #                     Accession #                Episode #  1  [id number]                   [id number]                 [id number] ---------------------------------------------------------------------- Indications  Pelvic pain affecting pregnancy in second       O26.892  trimester  [redacted] weeks gestation of pregnancy                 Z3A.15 ---------------------------------------------------------------------- Fetal Evaluation  Num Of Fetuses:          1  Fetal Heart Rate(bpm):   175  Cardiac Activity:        Observed  Presentation:  Breech  Placenta:                Posterior  P. Cord Insertion:       Visualized  Amniotic Fluid  AFI FV:      Within normal limits                              Largest Pocket(cm)                              3.95 ---------------------------------------------------------------------- OB History  Gravidity:    1         Term:   0        Prem:   0        SAB:   0  TOP:          0       Ectopic:  0        Living: 0 ---------------------------------------------------------------------- Gestational Age  LMP:           15w 6d        Date:  06/13/21                  EDD:   03/20/22  Best:          Hazle Quant 0d     Det. By:  Marcella Dubs         EDD:   03/26/22                                      (08/25/21) ---------------------------------------------------------------------- Anatomy  Cranium:               Appears normal         Kidneys:                Appear normal  Stomach:                Appears normal, left   Bladder:                Appears normal                         sided ---------------------------------------------------------------------- Cervix Uterus Adnexa  Cervix  Length:            3.4  cm.  Closed  Uterus  No abnormality visualized.  Right Ovary  Within normal limits.  Left Ovary  Within normal limits.  Cul De Sac  No free fluid seen.  Adnexa  No abnormality visualized. ---------------------------------------------------------------------- Comments  This patient presented to the MAU due to abdominal apain.  A limited ultrasound performed today shows a viable  singleton gestation in the breech presentation.  There was normal amniotic fluid noted.  A normal appearing posterior placenta is noted. ----------------------------------------------------------------------                   Ma Rings, MD Electronically Signed Final Report   10/02/2021 05:10 pm ----------------------------------------------------------------------   MAU Course  Procedures Lab Orders         Wet prep, genital         Culture, OB Urine         Urinalysis, Routine w reflex microscopic Urine, Clean Catch     Meds ordered this encounter  Medications  azithromycin (ZITHROMAX) tablet 1,000 mg   Imaging Orders         US MFM OB LIMITED      MDM FHT present via doppler GC/CT & wet prep recollected to check for additional infections. Given azithromycin in MAU to treat known chlamydia  Cervix close. Limited ultrasound shows normal cervical length.  Assessment and Plan   1. Chlamydia infection affecting pregnancy in second trimester   2. Pelvic pain affecting pregnancy in second trimester, antepartum   3. [redacted] weeks gestation of pregnancy    -chlamydia treated in MAU. Patient knows to have partner go for treatment. No intercourse x 2 weeks  -Urine culture & GC/CT pending -reviewed return precautions  -[facility] provided Spanish interpreter used for this encounter  Judeth HornErin Josphine Laffey,  NP 10/02/21 5:33 PM

## 2021-10-02 NOTE — MAU Note (Signed)
Eileen Johnson is a 21 y.o. at [redacted]w[redacted]d here in MAU reporting: abdominal and hip pain for 3 days. Denies bleeding. States yellow discharge.  Onset of complaint: ongoing  Pain score: 7/10  Vitals:   10/02/21 1528  BP: 119/69  Pulse: 83  Resp: 16  Temp: 98.7 F (37.1 C)  SpO2: 98%     FHT:159  Lab orders placed from triage: UA

## 2021-10-04 LAB — GC/CHLAMYDIA PROBE AMP (~~LOC~~) NOT AT ARMC
Chlamydia: POSITIVE — AB
Comment: NEGATIVE
Comment: NORMAL
Neisseria Gonorrhea: NEGATIVE

## 2021-10-04 LAB — CULTURE, OB URINE: Culture: >=100000 — AB

## 2021-10-05 ENCOUNTER — Other Ambulatory Visit: Payer: Self-pay | Admitting: Obstetrics and Gynecology

## 2021-10-05 ENCOUNTER — Ambulatory Visit: Payer: Medicaid Other | Admitting: *Deleted

## 2021-10-05 DIAGNOSIS — Z3A15 15 weeks gestation of pregnancy: Secondary | ICD-10-CM

## 2021-10-05 MED ORDER — AMOXICILLIN 500 MG PO TABS: 500.0000 mg | ORAL_TABLET | Freq: Three times a day (TID) | ORAL | 0 refills | Status: DC

## 2021-10-05 NOTE — Progress Notes (Signed)
+   urine culture RX Amoxicillin.   Spanish interpretor used for phone call.  Noni Saupe I, NP 10/05/2021 10:54 AM

## 2021-10-05 NOTE — Progress Notes (Signed)
TC to pt using Spanish interpreter. No answer. Message says that the phone number is not a working number. Phone number confirmed on chart. Message sent via MyChart with Spanish translation. Requested pt call the office or message in [place] to update with a working phone number.

## 2021-10-12 ENCOUNTER — Ambulatory Visit (INDEPENDENT_AMBULATORY_CARE_PROVIDER_SITE_OTHER): Payer: Medicaid Other | Admitting: Advanced Practice Midwife

## 2021-10-12 ENCOUNTER — Encounter: Payer: Self-pay | Admitting: Advanced Practice Midwife

## 2021-10-12 VITALS — BP 122/75 | HR 92 | Wt 142.0 lb

## 2021-10-12 DIAGNOSIS — A749 Chlamydial infection, unspecified: Secondary | ICD-10-CM

## 2021-10-12 DIAGNOSIS — Z3400 Encounter for supervision of normal first pregnancy, unspecified trimester: Secondary | ICD-10-CM

## 2021-10-12 DIAGNOSIS — O98812 Other maternal infectious and parasitic diseases complicating pregnancy, second trimester: Secondary | ICD-10-CM

## 2021-10-12 DIAGNOSIS — Z3A16 16 weeks gestation of pregnancy: Secondary | ICD-10-CM

## 2021-10-12 DIAGNOSIS — Z3402 Encounter for supervision of normal first pregnancy, second trimester: Secondary | ICD-10-CM

## 2021-10-12 DIAGNOSIS — R772 Abnormality of alphafetoprotein: Secondary | ICD-10-CM | POA: Diagnosis not present

## 2021-10-12 DIAGNOSIS — O0990 Supervision of high risk pregnancy, unspecified, unspecified trimester: Secondary | ICD-10-CM | POA: Insufficient documentation

## 2021-10-12 HISTORY — DX: Encounter for supervision of normal first pregnancy, second trimester: Z34.02

## 2021-10-12 NOTE — Progress Notes (Signed)
Pt presents for NOB visit. Pt states Ampicillin caused nausea and low BP so she stopped taking it. No concerns at this time.

## 2021-10-12 NOTE — Progress Notes (Signed)
Subjective:   Eileen Johnson is a 21 y.o. G1P0 at 34w3dby early ultrasound being seen today for her first obstetrical visit.  Her obstetrical history is significant for  none, G1  and has Chlamydia infection affecting pregnancy in second trimester and Encounter for supervision of normal first pregnancy in second trimester on their problem list.. Patient does intend to breast feed. Pregnancy history fully reviewed.  Patient reports no complaints.  HISTORY: OB History  Gravida Para Term Preterm AB Living  1 0 0 0 0 0  SAB IAB Ectopic Multiple Live Births  0 0 0 0 0    # Outcome Date GA Lbr Len/2nd Weight Sex Delivery Anes PTL Lv  1 Current            History reviewed. No pertinent surgical history. Family History  Problem Relation Age of Onset  . Healthy Mother   . Diabetes Father    Social History   Tobacco Use  . Smoking status: Never    Passive exposure: Never  . Smokeless tobacco: Never  Vaping Use  . Vaping Use: Never used  Substance Use Topics  . Alcohol use: Never  . Drug use: Never   No Known Allergies Current Outpatient Medications on File Prior to Visit  Medication Sig Dispense Refill  . amoxicillin (AMOXIL) 500 MG tablet Take 1 tablet (500 mg total) by mouth in the morning, at noon, and at bedtime. (Patient not taking: Reported on 10/12/2021) 21 tablet 0   No current facility-administered medications on file prior to visit.     Indications for ASA therapy (per uptodate) One of the following: Previous pregnancy with preeclampsia, especially early onset and with an adverse outcome No Multifetal gestation No Chronic hypertension No Type 1 or 2 diabetes mellitus No Chronic kidney disease No Autoimmune disease (antiphospholipid syndrome, systemic lupus erythematosus) No   Two or more of the following: Nulliparity Yes Obesity (body mass index >30 kg/m2) No Family history of preeclampsia in mother or sister No Age ?35 years No Sociodemographic  characteristics (African American race, low socioeconomic level) No Personal risk factors (eg, previous pregnancy with low birth weight or small for gestational age infant, previous adverse pregnancy outcome [eg, stillbirth], interval >10 years between pregnancies) No   Indications for early 1 hour GTT (per uptodate)  BMI >25 (>23 in Asian women) AND one of the following  Gestational diabetes mellitus in a previous pregnancy No Glycated hemoglobin ?5.7 percent (39 mmol/mol), impaired glucose tolerance, or impaired fasting glucose on previous testing No First-degree relative with diabetes No High-risk race/ethnicity (eg, African American, Latino, Native American, AC[place], Pacific Islander) Yes History of cardiovascular disease No Hypertension or on therapy for hypertension No High-density lipoprotein cholesterol level <35 mg/dL (0.90 mmol/L) and/or a triglyceride level >250 mg/dL (2.82 mmol/L) No Polycystic ovary syndrome No Physical inactivity No Other clinical condition associated with insulin resistance (eg, severe obesity, acanthosis nigricans) No Previous birth of an infant weighing ?4000 g No Previous stillbirth of unknown cause No Exam   Vitals:   10/12/21 1541  BP: 122/75  Pulse: 92  Weight: 142 lb (64.4 kg)    VS reviewed, nursing note reviewed,  Constitutional: well developed, well nourished, no distress HEENT: normocephalic CV: normal rate Pulm/chest wall: normal effort Abdomen: soft Neuro: alert and oriented x 3 Skin: warm, dry Psych: affect normal     Assessment:   Pregnancy: G1P0 Patient Active Problem List   Diagnosis Date Noted  . Encounter for  supervision of normal first pregnancy in second trimester 10/12/2021  . Chlamydia infection affecting pregnancy in second trimester 10/02/2021     Plan:  1. Supervision of normal first pregnancy, antepartum --Anticipatory guidance about next visits/weeks of pregnancy given.   - Panorama Prenatal Test  Full Panel - [facility] - AFP, Serum, Open Spina Bifida - CBC/D/Plt+RPR+Rh+ABO+RubIgG... - Comp Met (CMET) - Hemoglobin A1c  2. [redacted] weeks gestation of pregnancy   3. Chlamydia infection affecting pregnancy in second trimester --Pt treated on 9/16 in MAU --TOC after 10/7, pt to do testing at next visit --Partner did not know where to go for treatment, Expedited Partner Therapy with azithromycin 1000 mg Rx with no refills written today for Tomasa Hose today.   --Pt reports she has not had intercourse since her treatment so no additional treatment was given for the patient today    Initial labs drawn. Continue prenatal vitamins. Discussed and offered genetic screening options, including Quad screen/AFP, NIPS testing, and option to decline testing. Benefits/risks/alternatives reviewed. Pt aware that anatomy US is form of genetic screening with lower accuracy in detecting trisomies than blood work.  Pt chooses genetic screening today. NIPS: ordered. Ultrasound discussed; fetal anatomic survey: ordered. Problem list reviewed and updated. The nature of [facility] with multiple MDs and other Advanced Practice Providers was explained to patient; also emphasized that residents, students are part of our team. Routine obstetric precautions reviewed. No follow-ups on file.   Fatima Blank, CNM 10/12/21 5:05 PM

## 2021-10-14 LAB — COMPREHENSIVE METABOLIC PANEL
ALT: 14 IU/L (ref 0–32)
AST: 20 IU/L (ref 0–40)
Albumin/Globulin Ratio: 1.3 (ref 1.2–2.2)
Albumin: 3.9 g/dL — ABNORMAL LOW (ref 4.0–5.0)
Alkaline Phosphatase: 67 IU/L (ref 42–106)
BUN/Creatinine Ratio: 8 — ABNORMAL LOW (ref 9–23)
BUN: 5 mg/dL — ABNORMAL LOW (ref 6–20)
Bilirubin Total: <0.2 mg/dL (ref 0.0–1.2)
CO2: 19 mmol/L — ABNORMAL LOW (ref 20–29)
Calcium: 9.6 mg/dL (ref 8.7–10.2)
Chloride: 102 mmol/L (ref 96–106)
Creatinine, Ser: 0.66 mg/dL (ref 0.57–1.00)
Globulin, Total: 2.9 g/dL (ref 1.5–4.5)
Glucose: 80 mg/dL (ref 70–99)
Potassium: 4.2 mmol/L (ref 3.5–5.2)
Sodium: 135 mmol/L (ref 134–144)
Total Protein: 6.8 g/dL (ref 6.0–8.5)
eGFR: 129 mL/min/{1.73_m2} (ref >59)

## 2021-10-14 LAB — AFP, SERUM, OPEN SPINA BIFIDA
AFP MoM: 5.6
AFP Value: 196.3 ng/mL
Gest. Age on Collection Date: 16 weeks
Maternal Age At EDD: 21.2 yr
OSBR Risk 1 IN: 10
Test Results:: POSITIVE — AB
Weight: 142 [lb_av]

## 2021-10-14 LAB — CBC/D/PLT+RPR+RH+ABO+RUBIGG...
Antibody Screen: NEGATIVE
Basophils Absolute: 0.1 10*3/uL (ref 0.0–0.2)
Basos: 0 %
EOS (ABSOLUTE): 0 10*3/uL (ref 0.0–0.4)
Eos: 0 %
HCV Ab: NONREACTIVE
HIV Screen 4th Generation wRfx: NONREACTIVE
Hematocrit: 42.7 % (ref 34.0–46.6)
Hemoglobin: 14.3 g/dL (ref 11.1–15.9)
Hepatitis B Surface Ag: NEGATIVE
Immature Grans (Abs): 0 10*3/uL (ref 0.0–0.1)
Immature Granulocytes: 0 %
Lymphocytes Absolute: 2.7 10*3/uL (ref 0.7–3.1)
Lymphs: 18 %
MCH: 29.9 pg (ref 26.6–33.0)
MCHC: 33.5 g/dL (ref 31.5–35.7)
MCV: 89 fL (ref 79–97)
Monocytes Absolute: 0.9 10*3/uL (ref 0.1–0.9)
Monocytes: 6 %
Neutrophils Absolute: 10.9 10*3/uL — ABNORMAL HIGH (ref 1.4–7.0)
Neutrophils: 76 %
Platelets: 295 10*3/uL (ref 150–450)
RBC: 4.79 x10E6/uL (ref 3.77–5.28)
RDW: 12.3 % (ref 11.7–15.4)
RPR Ser Ql: NONREACTIVE
Rh Factor: POSITIVE
Rubella Antibodies, IGG: 2.36 index (ref >0.99)
WBC: 14.6 10*3/uL — ABNORMAL HIGH (ref 3.4–10.8)

## 2021-10-14 LAB — HEMOGLOBIN A1C
Est. average glucose Bld gHb Est-mCnc: 108 mg/dL
Hgb A1c MFr Bld: 5.4 % (ref 4.8–5.6)

## 2021-10-14 LAB — HCV INTERPRETATION

## 2021-10-16 NOTE — Addendum Note (Signed)
Addended by: Fatima Blank A on: 10/16/2021 12:13 AM   Modules accepted: Orders

## 2021-10-18 LAB — PANORAMA PRENATAL TEST FULL PANEL:PANORAMA TEST PLUS 5 ADDITIONAL MICRODELETIONS: FETAL FRACTION: 13.9

## 2021-10-18 NOTE — Progress Notes (Signed)
TC to pt using Spanish interpreter. No answer. Left HIPPA compliant VM. Sticky note and next appt note updated.

## 2021-10-20 NOTE — Progress Notes (Signed)
2nd attempt to reach pt using Spanish interpreter. No answer. VM full. MyChart message with Spanish translation sent. MFM [place] appt note updated advising unable to inform pt of abnormal AFP.

## 2021-10-23 LAB — HORIZON CUSTOM: REPORT SUMMARY: NEGATIVE

## 2021-10-28 ENCOUNTER — Ambulatory Visit (HOSPITAL_BASED_OUTPATIENT_CLINIC_OR_DEPARTMENT_OTHER): Payer: Medicaid Other

## 2021-10-28 ENCOUNTER — Ambulatory Visit: Payer: Medicaid Other | Attending: Advanced Practice Midwife

## 2021-10-28 ENCOUNTER — Ambulatory Visit: Payer: Medicaid Other | Admitting: *Deleted

## 2021-10-28 ENCOUNTER — Ambulatory Visit (HOSPITAL_BASED_OUTPATIENT_CLINIC_OR_DEPARTMENT_OTHER): Payer: Medicaid Other | Admitting: Maternal & Fetal Medicine

## 2021-10-28 ENCOUNTER — Encounter: Payer: Self-pay | Admitting: *Deleted

## 2021-10-28 ENCOUNTER — Other Ambulatory Visit: Payer: Self-pay | Admitting: *Deleted

## 2021-10-28 VITALS — BP 111/73 | HR 82

## 2021-10-28 DIAGNOSIS — Z3400 Encounter for supervision of normal first pregnancy, unspecified trimester: Secondary | ICD-10-CM | POA: Diagnosis present

## 2021-10-28 DIAGNOSIS — O09899 Supervision of other high risk pregnancies, unspecified trimester: Secondary | ICD-10-CM | POA: Insufficient documentation

## 2021-10-28 DIAGNOSIS — O280 Abnormal hematological finding on antenatal screening of mother: Secondary | ICD-10-CM

## 2021-10-28 DIAGNOSIS — O26892 Other specified pregnancy related conditions, second trimester: Secondary | ICD-10-CM | POA: Diagnosis not present

## 2021-10-28 DIAGNOSIS — Z3402 Encounter for supervision of normal first pregnancy, second trimester: Secondary | ICD-10-CM

## 2021-10-28 DIAGNOSIS — O98812 Other maternal infectious and parasitic diseases complicating pregnancy, second trimester: Secondary | ICD-10-CM | POA: Insufficient documentation

## 2021-10-28 DIAGNOSIS — O358XX0 Maternal care for other (suspected) fetal abnormality and damage, not applicable or unspecified: Secondary | ICD-10-CM | POA: Diagnosis not present

## 2021-10-28 DIAGNOSIS — R772 Abnormality of alphafetoprotein: Secondary | ICD-10-CM | POA: Diagnosis present

## 2021-10-28 DIAGNOSIS — A749 Chlamydial infection, unspecified: Secondary | ICD-10-CM | POA: Insufficient documentation

## 2021-10-28 DIAGNOSIS — Z3A18 18 weeks gestation of pregnancy: Secondary | ICD-10-CM | POA: Insufficient documentation

## 2021-10-28 DIAGNOSIS — O3482 Maternal care for other abnormalities of pelvic organs, second trimester: Secondary | ICD-10-CM | POA: Diagnosis not present

## 2021-10-28 DIAGNOSIS — O3480 Maternal care for other abnormalities of pelvic organs, unspecified trimester: Secondary | ICD-10-CM

## 2021-10-28 DIAGNOSIS — Z362 Encounter for other antenatal screening follow-up: Secondary | ICD-10-CM

## 2021-10-28 NOTE — Progress Notes (Signed)
[facility] at [facility] [place], [address] Phone:  [phone]   Fax:  [phone]    Name: Eileen Johnson Indication: Abnormal MSAFP  DOB: Nov 08, 2000 Age: 21 y.o.   EDC: 03/26/2022 LMP: 06/13/2021 Referring Provider:  Elvera Maria  EGA: [redacted]w[redacted]d Genetic Counselor: Staci Righter, MS, CGC  OB Hx: G1P0 Date of Appointment: 10/28/2021  Accompanied by: Spanish interpreter Face to Face Time: 30 Minutes   Previous Testing Completed: Eileen Johnson previously completed cell-free DNA screening (cfDNA) in this pregnancy. The result is low risk. This screening significantly reduces the risk that the current pregnancy has Down syndrome, Trisomy 70, Trisomy 38, and common sex chromosome conditions, however, the risk is not zero given the limitations of cfDNA. Additionally, there are many genetic conditions that cannot be detected by cfDNA.  Eileen Johnson previously completed carrier screening. She screened to not be a carrier for Cystic Fibrosis (CF), Spinal Muscular Atrophy (SMA), alpha thalassemia, and beta hemoglobinopathies. A negative result on carrier screening reduces the likelihood of being a carrier, however, does not entirely rule out the possibility.   Medical History:  Reports she takes folic acid, prenatal vitamins, and tylenol. Denies personal history of diabetes, high blood pressure, thyroid conditions, and seizures. Denies bleeding, infections, and fevers in this pregnancy. Denies using tobacco, alcohol, or street drugs in this pregnancy.   Family History: A pedigree was created and scanned into Epic under the Media tab. Maternal ethnicity reported as Hispanic and paternal ethnicity reported as Hispanic. Denies Ashkenazi Jewish ancestry. Family history not remarkable for consanguinity, individuals with birth defects, intellectual disability, autism spectrum disorder, multiple spontaneous abortions, still births, or unexplained neonatal  death.     Genetic Counseling:   Abnormal Maternal Serum AFP. Eileen Johnson was seen for genetic counseling to discuss her Maternal Serum AFP (MSAFP) result. To review, the MSAFP is a maternal blood test that measures the AFP level in the maternal serum to determine if a pregnancy is at higher risk for certain birth defects or problems; however, it cannot diagnose or rule out these conditions. Eileen Johnson's MSAFP result indicates there is an increased level of AFP. The value was 5.60 MoM (multiple of the median) which is above the cutoff of 2.50 MoM. Increased levels of AFP can occur for many reasons including inaccurate pregnancy dating, the presence of twins, pregnancy/maternal complications, abdominal wall defects, open neural tube defects (ONTDs), or a normal pregnancy. The laboratory reports that the risk for Eileen Johnson's pregnancy to have an ONTD is 1 in 10. 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 Please see separate ultrasound report for details. Eileen Johnson's anatomy ultrasound did not detect an ONTD. Prenatal detection of ONTDs is largely influenced by the gestational age and type of neural tube defect. A second trimester ultrasound identifies virtually 100% of anencephaly cases. A second trimester ultrasound also routinely evaluates for spina bifida, and examination of the entire length of the spine in the sagittal, axial, and coronal planes in combination with a cranial evaluation identifies many cases: the detection rate is approximately 90-98%. We discussed that Eileen Johnson has the option of continuing to monitor the pregnancy via routine ultrasounds with the understanding that a normal ultrasound does not guarantee a healthy pregnancy. We also discussed the option of amniocentesis to evaluate the amniotic fluid AFP with reflex to acetylcholinesterase if indicated. After hearing the above information, Eileen Johnson declined amniocentesis for prenatal diagnostic  studies.   Birth Defects. All babies have approximately a  3-5% risk for a birth defect and a majority of these defects cannot be detected through the screening or diagnostic testing listed below. Ultrasound may detect some birth defects, but it may not detect all birth defects. About half of pregnancies with Down syndrome do not show any soft markers on ultrasound. A normal ultrasound does not guarantee a healthy pregnancy.    Testing/Screening Options:   Amniocentesis for Prenatal Diagnosis. This procedure involves the removal of a small amount of amniotic fluid from the sac surrounding the pregnancy with the use of a thin needle inserted through the maternal abdomen and uterus. Ultrasound guidance is used throughout the procedure. Possible procedural difficulties and complications that can arise include maternal infection, cramping, bleeding, fluid leakage, and/or pregnancy loss. The risk for pregnancy loss with an amniocentesis is 1/500. Testing that could be ordered on an amniocentesis sample includes AF-AFP and ACHE studies, a karyotype, microarray, and testing for specific syndromes. A karyotype can detect chromosomal aneuploidies as well as large deletions or duplications of chromosomal material and chromosomal rearrangements. Microarray assesses for smaller pieces of chromosomal material that are missing or extra that fall below the detection range of a karyotype. These chromosomal changes can be associated with various microdeletion or microduplication syndromes that could have impacts on one's health or development. A microarray can also detect some microdeletion and microduplications that have unclear clinical relevance (variants of uncertain significance) and consanguinity.      Patient Plan:  Proceed with: Routine prenatal care Informed consent was obtained. All questions were answered.  Declined: Amniocentesis for prenatal diagnostic studies   Thank you for sharing in the care of Eileen Johnson  with [place].  Please do not hesitate to contact us if you have any questions.  Teena Dunk, MS, [facility]

## 2021-10-29 NOTE — Progress Notes (Signed)
MFM Consult Note Patient Name: Eileen Johnson  Patient MRN:   [id number]  Referring provider: Merla Riches  Reason for Consult: Bilateral adnexal masses   HPI: Eileen Johnson is a 21 y.o. G1P0 at [redacted]w[redacted]d  here for ultrasound and consultation.   The patient has no complaints today and was not aware of these bilateral adnexal masses until today's sonogram.  She denies pelvic pain, cramping, nausea, vomiting or changes in bowel movements.  Hyperreactio luteinalis  Today's ultrasound is concerning for bilateral adnexal masses representative of hyperreactio luteinalis (HL). Sonographically there are characteristic "spoke wheel" appearance of each ovary due to the presence of multiple small cysts. I discussed this is an uncommon benign condition characterized by bilateral multicystic ovarian enlargements. It can also be associated with elevated serum androgen levels resulting in female pattern hair growth and deepening of the voice.  The etiology of hyperreactio luteinalis (HL) is unknown but is thought to be associated with production of high concentrations of hCG and increased ovarian sensitivity manifest as an exaggerated ovarian response leading to theca lutein cyst formation. It can be associated with very high steroid concentrations and is very rare in the first trimester. Elevated serum androgens may result in voice changes (hoarseness), as well as female pattern of hair distribution. It is not associated with trophoblastic disease. Depending on the size of the masses, which may be as a result of moderate-to-marked cystic ovarian enlargement, patients can present with complications like torsion, pressure symptoms, or intracystic hemorrhage. Treatment is dependent on symptoms, with abdominal pain and ovarian torsion being the main emergency situations. The ovarian cysts usually regress spontaneously without any medical intervention after delivery in the nonemergent situation.  However she also has an  elevated AFP which is a tumor marker and since there is no evidence of a fetal cause of this.  Based on a review of the existing literature there is no well-documented association between HDL and an elevated AFP.  Typically androgens can be elevated but not AFP.  She will be referred to a gynecology oncologist.  Review of Systems: A review of systems was performed and was negative except per HPI   Vitals and Physical Exam See intake sheet for vitals Sitting comfortably on the sonogram table Nonlabored breathing Normal rate and rhythm Abdomen is nontender  Genetic testing: Normal cell free DNA on panorama, elevated AFP  Sonographic findings Single intrauterine pregnancy. Observed fetal cardiac activity. Cephalic presentation. Fetal anatomy that was well seen appears normal without evidence of soft markers. Not all fetal structures were well seen due to a technically diffcult exam and the anatomic survey remains incomplete with limited views of the nose/lips, palate, heart, lateral vents and spine  Fetal biometry shows the estimated fetal weight at the 21 percentile.  Amniotic fluid volume: Within normal limits. Placenta: Posterior. Cervix: Closed with a cervical length of 2.5 cm. Adnexa: Bilateral adnexal masses with multiple small cysts in a spoke-wheel pattern consistent with hyperreactio luteinalis. No abdominal ascites.   I discussed the limitations of prenatal ultrasound with the patient including inability to detect certain abnormalities and poor visualization of certain anatomic structures due to fetal position, gestational age and maternal body habitus.  The patient verbalized understanding and had time to ask questions that were answered to her satisfaction.   Assessment - Bilateral adnexal masses, suggestive of hyperreactio luteinalis - Elevated AFP, no identifiable fetal cause  Plan - Continue to follow-up with growth ultrasounds - Precautions given about ovarian torsion   -  Due  to elevated AFP and no evidence of NTD or abdominal wall defect in the setting of adnexal masses, I will refer her to gyn oncology. A secure chat message has been sent to Dr. Eugene Garnet.  - If these are HL, then there is no need for surgery since they usually regress postpartum  I spent 45 minutes reviewing the patients chart, including labs and images as well as counseling the patient about her medical conditions.  Braxton Feathers  MFM, [facility]   10/29/2021  10:06 AM

## 2021-11-01 ENCOUNTER — Telehealth: Payer: Self-pay | Admitting: *Deleted

## 2021-11-01 NOTE — Telephone Encounter (Signed)
Attempted to reach the patient using [place] [phone]# [phone]) to schedule a new patient appt. No answer and voicemail was full.

## 2021-11-02 NOTE — Telephone Encounter (Signed)
Attempted to reach the patient using [place] [phone]# [phone]) to schedule a new patient appt. No answer and voicemail was full. Attempted to reach her Aunt with no answer also

## 2021-11-03 NOTE — Telephone Encounter (Signed)
Spoke with the patient using [employer] (ID [phone]). Spoke with the patient regarding the referral to GYN oncology. Patient scheduled as new patient with Dr Berline Lopes on 11:15 am. Patient given an arrival time of 10:45am  Explained to the patient the the doctor will perform a pelvic exam at this visit. Patient given the policy that no visitors under the 16 yrs are allowed in the [facility]. Patient given the address/phone number for the clinic and that the center offers free valet service.

## 2021-11-08 ENCOUNTER — Encounter: Payer: Self-pay | Admitting: Gynecologic Oncology

## 2021-11-08 ENCOUNTER — Telehealth: Payer: Self-pay

## 2021-11-08 NOTE — Telephone Encounter (Signed)
Contacted pt, via pacific interpreter Effie Shy [phone]. Meaningful use was updated for upcoming appointment with Dr. Berline Lopes on 10/24

## 2021-11-09 ENCOUNTER — Encounter (HOSPITAL_COMMUNITY): Payer: Self-pay | Admitting: Obstetrics & Gynecology

## 2021-11-09 ENCOUNTER — Encounter: Payer: Self-pay | Admitting: Advanced Practice Midwife

## 2021-11-09 ENCOUNTER — Inpatient Hospital Stay (HOSPITAL_COMMUNITY): Payer: Medicaid Other

## 2021-11-09 ENCOUNTER — Observation Stay (HOSPITAL_COMMUNITY)
Admission: AD | Admit: 2021-11-09 | Discharge: 2021-11-10 | Disposition: A | Discharge status: Home / Self Care | Payer: Medicaid Other | Attending: Obstetrics & Gynecology | Admitting: Obstetrics & Gynecology

## 2021-11-09 ENCOUNTER — Encounter: Payer: Self-pay | Admitting: Gynecologic Oncology

## 2021-11-09 ENCOUNTER — Other Ambulatory Visit (HOSPITAL_COMMUNITY)
Admission: RE | Admit: 2021-11-09 | Discharge: 2021-11-09 | Disposition: A | Discharge status: Home / Self Care | Payer: Medicaid Other | Source: Ambulatory Visit

## 2021-11-09 ENCOUNTER — Inpatient Hospital Stay: Payer: Medicaid Other

## 2021-11-09 ENCOUNTER — Ambulatory Visit (INDEPENDENT_AMBULATORY_CARE_PROVIDER_SITE_OTHER): Payer: Medicaid Other | Admitting: Advanced Practice Midwife

## 2021-11-09 ENCOUNTER — Inpatient Hospital Stay (HOSPITAL_BASED_OUTPATIENT_CLINIC_OR_DEPARTMENT_OTHER): Payer: Medicaid Other | Admitting: Gynecologic Oncology

## 2021-11-09 VITALS — BP 119/82 | HR 99 | Wt 162.4 lb

## 2021-11-09 VITALS — BP 125/81 | HR 98 | Resp 16 | Ht 62.0 in | Wt 163.3 lb

## 2021-11-09 DIAGNOSIS — N83202 Unspecified ovarian cyst, left side: Secondary | ICD-10-CM | POA: Insufficient documentation

## 2021-11-09 DIAGNOSIS — O26899 Other specified pregnancy related conditions, unspecified trimester: Secondary | ICD-10-CM | POA: Diagnosis present

## 2021-11-09 DIAGNOSIS — Z3400 Encounter for supervision of normal first pregnancy, unspecified trimester: Secondary | ICD-10-CM

## 2021-11-09 DIAGNOSIS — Z3A20 20 weeks gestation of pregnancy: Secondary | ICD-10-CM | POA: Insufficient documentation

## 2021-11-09 DIAGNOSIS — O26892 Other specified pregnancy related conditions, second trimester: Secondary | ICD-10-CM | POA: Diagnosis not present

## 2021-11-09 DIAGNOSIS — O3482 Maternal care for other abnormalities of pelvic organs, second trimester: Secondary | ICD-10-CM | POA: Insufficient documentation

## 2021-11-09 DIAGNOSIS — R772 Abnormality of alphafetoprotein: Secondary | ICD-10-CM | POA: Insufficient documentation

## 2021-11-09 DIAGNOSIS — R109 Unspecified abdominal pain: Secondary | ICD-10-CM

## 2021-11-09 DIAGNOSIS — N9489 Other specified conditions associated with female genital organs and menstrual cycle: Secondary | ICD-10-CM

## 2021-11-09 DIAGNOSIS — O98812 Other maternal infectious and parasitic diseases complicating pregnancy, second trimester: Secondary | ICD-10-CM | POA: Diagnosis not present

## 2021-11-09 DIAGNOSIS — N83201 Unspecified ovarian cyst, right side: Secondary | ICD-10-CM | POA: Insufficient documentation

## 2021-11-09 DIAGNOSIS — A749 Chlamydial infection, unspecified: Secondary | ICD-10-CM

## 2021-11-09 DIAGNOSIS — Z3402 Encounter for supervision of normal first pregnancy, second trimester: Secondary | ICD-10-CM

## 2021-11-09 DIAGNOSIS — R198 Other specified symptoms and signs involving the digestive system and abdomen: Secondary | ICD-10-CM | POA: Diagnosis not present

## 2021-11-09 DIAGNOSIS — Z3A21 21 weeks gestation of pregnancy: Secondary | ICD-10-CM

## 2021-11-09 DIAGNOSIS — N838 Other noninflammatory disorders of ovary, fallopian tube and broad ligament: Secondary | ICD-10-CM | POA: Diagnosis not present

## 2021-11-09 DIAGNOSIS — N83209 Unspecified ovarian cyst, unspecified side: Secondary | ICD-10-CM

## 2021-11-09 LAB — CBC WITH DIFFERENTIAL/PLATELET
Abs Immature Granulocytes: 0.04 10*3/uL (ref 0.00–0.07)
Basophils Absolute: 0.1 10*3/uL (ref 0.0–0.1)
Basophils Relative: 1 %
Eosinophils Absolute: 0 10*3/uL (ref 0.0–0.5)
Eosinophils Relative: 0 %
HCT: 38.1 % (ref 36.0–46.0)
Hemoglobin: 13.1 g/dL (ref 12.0–15.0)
Immature Granulocytes: 0 %
Lymphocytes Relative: 18 %
Lymphs Abs: 2.2 10*3/uL (ref 0.7–4.0)
MCH: 31.6 pg (ref 26.0–34.0)
MCHC: 34.4 g/dL (ref 30.0–36.0)
MCV: 91.8 fL (ref 80.0–100.0)
Monocytes Absolute: 0.8 10*3/uL (ref 0.1–1.0)
Monocytes Relative: 7 %
Neutro Abs: 9.2 10*3/uL — ABNORMAL HIGH (ref 1.7–7.7)
Neutrophils Relative %: 74 %
Platelets: 387 10*3/uL (ref 150–400)
RBC: 4.15 MIL/uL (ref 3.87–5.11)
RDW: 14 % (ref 11.5–15.5)
WBC: 12.3 10*3/uL — ABNORMAL HIGH (ref 4.0–10.5)
nRBC: 0 % (ref 0.0–0.2)

## 2021-11-09 LAB — COMPREHENSIVE METABOLIC PANEL
ALT: 35 U/L (ref 0–44)
AST: 37 U/L (ref 15–41)
Albumin: 2.4 g/dL — ABNORMAL LOW (ref 3.5–5.0)
Alkaline Phosphatase: 89 U/L (ref 38–126)
Anion gap: 8 (ref 5–15)
BUN: 6 mg/dL (ref 6–20)
CO2: 23 mmol/L (ref 22–32)
Calcium: 9.1 mg/dL (ref 8.9–10.3)
Chloride: 106 mmol/L (ref 98–111)
Creatinine, Ser: 0.92 mg/dL (ref 0.44–1.00)
GFR, Estimated: >60 mL/min (ref >60)
Glucose, Bld: 98 mg/dL (ref 70–99)
Potassium: 4 mmol/L (ref 3.5–5.1)
Sodium: 137 mmol/L (ref 135–145)
Total Bilirubin: 0.4 mg/dL (ref 0.3–1.2)
Total Protein: 6 g/dL — ABNORMAL LOW (ref 6.5–8.1)

## 2021-11-09 LAB — URINALYSIS, ROUTINE W REFLEX MICROSCOPIC
Bacteria, UA: NONE SEEN
Bilirubin Urine: NEGATIVE
Glucose, UA: NEGATIVE mg/dL
Hgb urine dipstick: NEGATIVE
Ketones, ur: NEGATIVE mg/dL
Nitrite: NEGATIVE
Protein, ur: NEGATIVE mg/dL
Specific Gravity, Urine: 1.017 (ref 1.005–1.030)
pH: 6 (ref 5.0–8.0)

## 2021-11-09 LAB — TYPE AND SCREEN
ABO/RH(D): O POS
Antibody Screen: NEGATIVE

## 2021-11-09 MED ORDER — HYDROMORPHONE HCL 1 MG/ML IJ SOLN
1.0000 mg | Freq: Once | INTRAMUSCULAR | Status: AC
  Administered 2021-11-09: 1 mg via INTRAVENOUS
  Filled 2021-11-09: qty 1

## 2021-11-09 MED ORDER — DOCUSATE SODIUM 100 MG PO CAPS
100.0000 mg | ORAL_CAPSULE | Freq: Every day | ORAL | Status: DC
  Administered 2021-11-10: 100 mg via ORAL
  Filled 2021-11-09: qty 1

## 2021-11-09 MED ORDER — CALCIUM CARBONATE ANTACID 500 MG PO CHEW: 2.0000 | CHEWABLE_TABLET | ORAL | Status: DC | PRN

## 2021-11-09 MED ORDER — PRENATAL MULTIVITAMIN CH: 1.0000 | ORAL_TABLET | Freq: Every day | ORAL | Status: DC

## 2021-11-09 MED ORDER — ACETAMINOPHEN 325 MG PO TABS: 650.0000 mg | ORAL_TABLET | ORAL | Status: DC | PRN

## 2021-11-09 MED ORDER — OXYCODONE-ACETAMINOPHEN 5-325 MG PO TABS
1.0000 | ORAL_TABLET | Freq: Four times a day (QID) | ORAL | Status: DC | PRN
  Administered 2021-11-10: 2 via ORAL
  Filled 2021-11-09: qty 2

## 2021-11-09 MED ORDER — HYDROMORPHONE HCL 1 MG/ML IJ SOLN
1.0000 mg | INTRAMUSCULAR | Status: DC | PRN
  Administered 2021-11-10: 1 mg via INTRAVENOUS
  Filled 2021-11-09: qty 1

## 2021-11-09 MED ORDER — LACTATED RINGERS IV SOLN: INTRAVENOUS | Status: DC

## 2021-11-09 NOTE — Progress Notes (Signed)
   PRENATAL VISIT NOTE  Subjective:  Eileen Johnson is a 21 y.o. G1P0 at [redacted]w[redacted]d being seen today for ongoing prenatal care.  She is currently monitored for the following issues for this low-risk pregnancy and has Chlamydia infection affecting pregnancy in second trimester and Encounter for supervision of normal first pregnancy in second trimester on their problem list.  Patient reports  Upper abdominal and back pain x 2 weeks, gradually worsening, difficult to sleep and her abdomen is tight and painful to touch .  Contractions: Not present. Vag. Bleeding: None.  Movement: Absent. Denies leaking of fluid.   The following portions of the patient's history were reviewed and updated as appropriate: allergies, current medications, past family history, past medical history, past social history, past surgical history and problem list.   Objective:   Vitals:   11/09/21 1520  BP: 119/82  Pulse: 99  Weight: 162 lb 6.4 oz (73.7 kg)    Fetal Status: Fetal Heart Rate (bpm): 148 Fundal Height: 34 cm Movement: Absent     General:  Alert, oriented and cooperative. Patient is in no acute distress.  Skin: Skin is warm and dry. No rash noted.   Cardiovascular: Normal heart rate noted  Respiratory: Normal respiratory effort, no problems with respiration noted  Abdomen: Soft, gravid, appropriate for gestational age.  Pain/Pressure: Present     Pelvic: Cervical exam deferred        Extremities: Normal range of motion.  Edema: None  Mental Status: Normal mood and affect. Normal behavior. Normal judgment and thought content.   Assessment and Plan:  Pregnancy: G1P0 at [redacted]w[redacted]d 1. Supervision of normal first pregnancy, antepartum --Anticipatory guidance about next visits/weeks of pregnancy given.  --Pt has not felt movement in 3 days, not completely abnormal at 20 weeks but pt was feeling movement prior to 3 days ago.  See assessment below.  2. Chlamydia infection affecting pregnancy in second  trimester --TOC collected today - Cervicovaginal ancillary only  3. [redacted] weeks gestation of pregnancy  4. Abdomen enlarged --On fundal measurement, pt abdomen palpated to be firm and significantly enlarged, greater than expected for gestational age. Abdomen tender to palpation.  FH 34-35 cm today at 20 weeks of pregnancy. --Given pt has worsening pain and swelling of abdomen, and abnormal finding of maternal ovaries on ultrasound, pt sent to MAU for further evaluation.   5. Abdominal pain during pregnancy, second trimester   6. Ovarian cyst affecting pregnancy in second trimester, antepartum    Preterm labor symptoms and general obstetric precautions including but not limited to vaginal bleeding, contractions, leaking of fluid and fetal movement were reviewed in detail with the patient. Please refer to After Visit Summary for other counseling recommendations.   No follow-ups on file.  Future Appointments  Date Time Provider [place]  11/17/2021  3:00 PM [facility]-MR 1 [facility]-MRI [place]  11/25/2021  1:30 PM [facility] NURSE [facility]  11/25/2021  1:45 PM [facility] US5 [facility]  12/08/2021  2:50 PM Gavin Pound, CNM [facility] None  12/23/2021  1:30 PM [facility] NURSE [facility]  12/23/2021  1:45 PM [facility] US5 [facility] [place]    Fatima Blank, CNM

## 2021-11-09 NOTE — MAU Note (Signed)
RN called radiology to inquire about an estimated time. Radiology does not have a time estimate currently, but will call back once they are available to take patient.

## 2021-11-09 NOTE — Patient Instructions (Signed)
It was very nice to meet you today.  Based on reviewing your ultrasound, I believe that you have multiple cysts on both of your ovaries we can sometimes see during pregnancy.  If this is what is happening, it does not require any treatment and is something that we can just follow.  Because one of your lab tests was abnormal in September (done as part of the testing for the fetus), your OB doctor wanted to make sure that there was no concern for an ovarian cancer.  There is a rare type of ovarian cancer that can have higher levels of the same blood test that was high for you in September.  It is much more likely that this is only related to the pregnancy and not to what we are seeing on your ovaries.  I would like to repeat this blood test today.  If it is decreased or normal, I do not think we need to get additional imaging.  If it remains high or has increased, we will plan to get a pelvic MRI.  This is a special type of scan that will give [place] a closer look at both ovaries.

## 2021-11-09 NOTE — MAU Note (Signed)
...  Eileen Johnson is a 21 y.o. at [redacted]w[redacted]d here in MAU reporting: Abdominal pain x10 days. She reports her pain first originated across her whole upper abdomen and with each day her pain has gotten worse. She reports her abdomen is hard. She also reports when she eats this increases her pain and she reports her abdomen feels "inflamed." She also states when she lies down flat this increases her pain as well as causes her to experience mid to upper back pain. Denies VB or LOF. Feels occasional movements from baby.   Last bowel movement one week ago. Has not taken any stool softeners. She reports she took 650 mg of Tylenol last night and has not had any since.  Pain score:  10/10 entire upper abdomen 9/10 mid to lower abdomen  FHT: 152 doppler Lab orders placed from triage:  UA

## 2021-11-09 NOTE — MAU Note (Signed)
Upon taking patient to the restroom to leave a urine sample patient stated she did not have to urinate at this time. RN asked the patient the last time that she urinated today and she stated 0800 this morning when she woke up. RN asked patient how many bottles of water she has drunk today and the patient reported three. Patient denies any urge to urinate. CNM notified.

## 2021-11-09 NOTE — Progress Notes (Signed)
Patient presents for [place] visit. Pt reports having pain in her upper gastric region. She was given tylenol for the pain, but it doesn't help much. No other concerns at this time.

## 2021-11-09 NOTE — Progress Notes (Signed)
GYNECOLOGIC ONCOLOGY NEW PATIENT CONSULTATION   Patient Name: Eileen Johnson  Patient Age: 21 y.o. Date of Service: 11/09/21 Referring Provider: Braxton Feathers, DO [address] [place],  [place] [address]   Primary Care Provider: Pcp, No Consulting Provider: Eugene Garnet, MD   Assessment/Plan:  21 year old female in second trimester of pregnancy with elevated AFP and recent ultrasound showing bilateral, enlarged, and multicystic ovaries.  I discussed findings of the ultrasound with the patient and her partner.  The show enlarged, multicystic ovaries bilaterally.  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   We discussed AFP, which can be elevated in the setting of pregnancy as well as some cancers (liver, germ cell tumors).  Although I do not think the appearance of the ovaries is consistent with malignancy, I recommend that we repeat an AFP.  If the AFP continues to be elevated or has increased, given normal Panoroma and no abnormalities on anatomy ultrasound, I would recommend MRI of the pelvis without contrast to better assess the adnexa.  AFP will be drawn today.  MRI was tentatively scheduled for next week.  If AFP levels have decreased or normalized, MRI can be canceled.  Patient is scheduled for routine OB appointment later today.  Encouraged her to attend this especially given size of her abdomen.  A copy of this note was sent to the patient's referring provider.   55 minutes of total time was spent for this patient encounter, including preparation, face-to-face counseling with the patient and coordination of care, and documentation of the encounter.   Eugene Garnet, MD  Division of Gynecologic Oncology  Department of Obstetrics and Gynecology  [facility]  ___________________________________________  Chief  Complaint: Chief Complaint  Patient presents with   Adnexal mass    bilateral    History of Present Illness:  Eileen Johnson is a 21 y.o. y.o. female who is seen in consultation at the request of Braxton Feathers, DO for an evaluation of elevated AFP in the setting of bilateral adnexal masses.  The patient is currently [redacted]w[redacted]d gestational age.  She presents today with her partner, with whom she lives.  Denies feeling any fetal movement yet.  She had some vaginal bleeding at the beginning of pregnancy, denies any vaginal bleeding or spotting recently.  Denies any pelvic pain or cramping.  She endorses a good appetite without nausea or emesis.  She has developed some constipation with the pregnancy.  She denies any urinary symptoms other than some increased frequency.  At her OB ultrasound on 10/02/2021, neither ovary noted to be abnormal.  AFP was done as part of quad screen on 9/26 and was elevated at 196.3.  The ultrasound on 10/12/202 showed the following: Biometry performed and consistent with an estimated fetal weight of 231 g.  Fetal anatomy seen appears normal without evidence of soft markers.  Not all fetal structures were well seen and anatomic survey noted to remain incomplete with limited views of the nose, lips, palate, heart, lateral vents and spine.  Bilateral adnexal masses with multiple small cysts in a spoke wheal pattern consistent with hyperreactio luteinalis. No ascites.  Genetic testing showed normal cell free DNA on panorama.  PAST MEDICAL HISTORY:  No significant history  PAST SURGICAL HISTORY:  Past Surgical History:  Procedure Laterality Date   NO PAST SURGERIES      OB/GYN HISTORY:  OB History  Gravida  Para Term Preterm AB Living  1            SAB IAB Ectopic Multiple Live Births               # Outcome Date GA Lbr Len/2nd Weight Sex Delivery Anes PTL Lv  1 Current             Patient's last menstrual period was 06/13/2021 (exact date).  Age at  menarche: 35 Age at menopause: n/a Hx of HRT: n/a Hx of STDs: denies Last pap: 2 years ago History of abnormal pap smears: denies  MEDICATIONS: Outpatient Encounter Medications as of 11/09/2021  Medication Sig   Prenatal MV & Min w/FA-DHA (PRENATAL GUMMIES PO) Take by mouth.   [DISCONTINUED] amoxicillin (AMOXIL) 500 MG tablet Take 1 tablet (500 mg total) by mouth in the morning, at noon, and at bedtime. (Patient not taking: Reported on 10/12/2021)   No facility-administered encounter medications on file as of 11/09/2021.    ALLERGIES:  No Known Allergies   FAMILY HISTORY:  Family History  Problem Relation Age of Onset   Healthy Mother    Diabetes Father    Diabetes Paternal Grandmother    Colon cancer Neg Hx    Breast cancer Neg Hx    Ovarian cancer Neg Hx    Endometrial cancer Neg Hx    Pancreatic cancer Neg Hx    Prostate cancer Neg Hx      SOCIAL HISTORY:  Social Connections: Not on file    REVIEW OF SYSTEMS:  + Distention, pain, constipation, urinary frequency, back pain. Denies appetite changes, fevers, chills, fatigue, unexplained weight changes. Denies hearing loss, neck lumps or masses, mouth sores, ringing in ears or voice changes. Denies cough or wheezing.  Denies shortness of breath. Denies chest pain or palpitations. Denies leg swelling. Denies blood in stools, diarrhea, nausea, vomiting, or early satiety. Denies pain with intercourse, dysuria, hematuria or incontinence. Denies hot flashes, pelvic pain, vaginal bleeding or vaginal discharge.   Denies joint pain or muscle pain/cramps. Denies itching, rash, or wounds. Denies dizziness, headaches, numbness or seizures. Denies swollen lymph nodes or glands, denies easy bruising or bleeding. Denies anxiety, depression, confusion, or decreased concentration.  Physical Exam:  Vital Signs for this encounter:  Blood pressure 125/81, pulse 98, resp. rate 16, height 5\' 2"  (1.575 m), weight 163 lb 5 oz (74.1 kg),  last menstrual period 06/13/2021, SpO2 98 %. Body mass index is 29.87 kg/m. General: Alert, oriented, no acute distress.  HEENT: Normocephalic, atraumatic. Sclera anicteric.  Chest: Clear to auscultation bilaterally. No wheezes, rhonchi, or rales. Cardiovascular: Regular rate and rhythm, no murmurs, rubs, or gallops.  Abdomen: Gravid uterus, fundus difficult to appreciate but palpated significantly above where I would expect given gestational age.  Most of abdomen is moderately firm, no significant tenderness to palpation.  Extremities: Grossly normal range of motion. Warm, well perfused. No edema bilaterally.  Skin: No rashes or lesions.  Lymphatics: No cervical, supraclavicular, or inguinal adenopathy.  GU:  Normal external female genitalia. No lesions. No discharge or bleeding.            Bimanual: External exam performed with normal cervix on palpation.  Unfortunately given the size of the uterus, no adnexa appreciated either with abdominal hand or in the cul-de-sac.  LABORATORY AND RADIOLOGIC DATA:  Outside medical records were reviewed to synthesize the above history, along with the history and physical obtained during the visit.   Lab Results  Component Value Date  WBC 14.6 (H) 10/12/2021   HGB 14.3 10/12/2021   HCT 42.7 10/12/2021   PLT 295 10/12/2021   GLUCOSE 80 10/12/2021   ALT 14 10/12/2021   AST 20 10/12/2021   NA 135 10/12/2021   K 4.2 10/12/2021   CL 102 10/12/2021   CREATININE 0.66 10/12/2021   BUN 5 (L) 10/12/2021   CO2 19 (L) 10/12/2021   HGBA1C 5.4 10/12/2021

## 2021-11-09 NOTE — H&P (Signed)
History     CSN: [id number]  Arrival date and time: 11/09/21 1732   Event Date/Time   First Provider Initiated Contact with Patient 11/09/21 1824      Chief Complaint  Patient presents with   Abdominal Pain   HPI  Eileen Johnson is a 21 y.o. G1P0 at [redacted]w[redacted]d who presents for evaluation of abdominal pain. Patient reports for the last 10 days, she feels like her abdomen has been getting larger and more painful. Today she is rating the pain a 10/10 and has tried tylenol without relief. She reports the worst of the pain is at the top of her abdomen and she's tender when she touches it. She reports her abdomen feels tight and like the skin is stretching. She reports this all happened really fast over the last ten days. She reports she has not been able to urinate since 0800 despite drinking 3 bottles of water today. She also reports she has not had a bowel movement in 7 days.   She denies any shortness of breath or work with breathing despite appearing to be short of breath while talking. Denies any chest pain.  She denies any vaginal bleeding, discharge, and leaking of fluid. Denies any constipation, diarrhea or any urinary complaints. Reports feeling intermittent fetal movement.  She is suspected to have hyperreactio luteinalis based off of previous ultrasounds at [facility]. She also had an elevated AFP with no apparent fetal cause so there was concern for possible malignancy of the ovaries. She saw gyn/onc today where the AFP was repeated and scheduled for outpatient MRI. When she presented to her OB appointment, she was obviously in pain with a significantly distended abdomen and was sent to MAU for further evaluation.   OB History     Gravida  1   Para      Term      Preterm      AB      Living         SAB      IAB      Ectopic      Multiple      Live Births              Past Medical History:  Diagnosis Date   Encounter for supervision of normal first  pregnancy in second trimester 10/12/2021    Nursing Staff Provider Office Location [facility] Dating  03/26/2022, by Ultrasound [facility] Model [ ]  Traditional [ ]  Centering [ ]  Mom-Baby Dyad Anatomy   Language  Spanish   Flu Vaccine   Genetic/Carrier Screen  NIPS:    AFP:    Horizon: TDaP Vaccine    Hgb A1C or  GTT Early  Third trimester  COVID Vaccine    LAB RESULTS  Rhogam  --/--/O POS Performed at [facility]   Medical history non-contributory     Past Surgical History:  Procedure Laterality Date   NO PAST SURGERIES      Family History  Problem Relation Age of Onset   Healthy Mother    Diabetes Father    Diabetes Paternal Grandmother    Colon cancer Neg Hx    Breast cancer Neg Hx    Ovarian cancer Neg Hx    Endometrial cancer Neg Hx    Pancreatic cancer Neg Hx    Prostate cancer Neg Hx     Social History   Tobacco Use   Smoking status: Never    Passive exposure: Never  Smokeless tobacco: Never  Vaping Use   Vaping Use: Never used  Substance Use Topics   Alcohol use: Never   Drug use: Never    Allergies: No Known Allergies  Medications Prior to Admission  Medication Sig Dispense Refill Last Dose   Prenatal MV & Min w/FA-DHA (PRENATAL GUMMIES PO) Take by mouth.   11/09/2021    Review of Systems  Constitutional: Negative.  Negative for fatigue and fever.  HENT: Negative.    Respiratory: Negative.  Negative for shortness of breath.   Cardiovascular: Negative.  Negative for chest pain.  Gastrointestinal:  Positive for abdominal pain and constipation. Negative for diarrhea, nausea and vomiting.  Genitourinary:  Positive for decreased urine volume, difficulty urinating and pelvic pain. Negative for dysuria, vaginal bleeding and vaginal discharge.  Neurological: Negative.  Negative for dizziness and headaches.   Physical Exam   Blood pressure 133/89, pulse 100, temperature 98.4 F (36.9 C), temperature source Oral, resp. rate 17, height 5\' 2"  (1.575 m), weight 74 kg,  last menstrual period 06/13/2021, SpO2 99 %.  Patient Vitals for the past 24 hrs:  BP Temp Temp src Pulse Resp SpO2 Height Weight  11/09/21 1817 133/89 -- -- 100 -- -- -- --  11/09/21 1752 132/86 98.4 F (36.9 C) Oral (!) 117 17 99 % 5\' 2"  (1.575 m) 74 kg    Physical Exam Vitals and nursing note reviewed.  Constitutional:      General: She is not in acute distress.    Appearance: She is well-developed.  HENT:     Head: Normocephalic.  Eyes:     Pupils: Pupils are equal, round, and reactive to light.  Cardiovascular:     Rate and Rhythm: Normal rate and regular rhythm.     Heart sounds: Normal heart sounds.  Pulmonary:     Effort: Tachypnea and accessory muscle usage present. No respiratory distress.     Breath sounds: Normal breath sounds.  Abdominal:     General: Abdomen is protuberant. There is distension.     Palpations: Abdomen is rigid.     Tenderness: There is abdominal tenderness in the right upper quadrant, epigastric area and left upper quadrant.  Skin:    General: Skin is warm and dry.     Comments: Diffuse redness across lower portion of abdomen  Neurological:     Mental Status: She is alert and oriented to person, place, and time.  Psychiatric:        Mood and Affect: Mood normal.        Behavior: Behavior normal.        Thought Content: Thought content normal.        Judgment: Judgment normal.        FHT: 152 bpm  MAU Course  Procedures  Results for orders placed or performed during the hospital encounter of 11/09/21 (from the past 24 hour(s))  CBC with Differential/Platelet     Status: Abnormal   Collection Time: 11/09/21  6:26 PM  Result Value Ref Range   WBC 12.3 (H) 4.0 - 10.5 K/uL   RBC 4.15 3.87 - 5.11 MIL/uL   Hemoglobin 13.1 12.0 - 15.0 g/dL   HCT 38.1 36.0 - 46.0 %   MCV 91.8 80.0 - 100.0 fL   MCH 31.6 26.0 - 34.0 pg   MCHC 34.4 30.0 - 36.0 g/dL   RDW 14.0 11.5 - 15.5 %   Platelets 387 150 - 400 K/uL   nRBC 0.0 0.0 - 0.2 %  Neutrophils Relative % 74 %   Neutro Abs 9.2 (H) 1.7 - 7.7 K/uL   Lymphocytes Relative 18 %   Lymphs Abs 2.2 0.7 - 4.0 K/uL   Monocytes Relative 7 %   Monocytes Absolute 0.8 0.1 - 1.0 K/uL   Eosinophils Relative 0 %   Eosinophils Absolute 0.0 0.0 - 0.5 K/uL   Basophils Relative 1 %   Basophils Absolute 0.1 0.0 - 0.1 K/uL   Immature Granulocytes 0 %   Abs Immature Granulocytes 0.04 0.00 - 0.07 K/uL  Comprehensive metabolic panel     Status: Abnormal   Collection Time: 11/09/21  6:26 PM  Result Value Ref Range   Sodium 137 135 - 145 mmol/L   Potassium 4.0 3.5 - 5.1 mmol/L   Chloride 106 98 - 111 mmol/L   CO2 23 22 - 32 mmol/L   Glucose, Bld 98 70 - 99 mg/dL   BUN 6 6 - 20 mg/dL   Creatinine, Ser 6.78 0.44 - 1.00 mg/dL   Calcium 9.1 8.9 - 93.8 mg/dL   Total Protein 6.0 (L) 6.5 - 8.1 g/dL   Albumin 2.4 (L) 3.5 - 5.0 g/dL   AST 37 15 - 41 U/L   ALT 35 0 - 44 U/L   Alkaline Phosphatase 89 38 - 126 U/L   Total Bilirubin 0.4 0.3 - 1.2 mg/dL   GFR, Estimated >10 >17 mL/min   Anion gap 8 5 - 15  Type and screen  [facility]     Status: None   Collection Time: 11/09/21  6:26 PM  Result Value Ref Range   ABO/RH(D) O POS    Antibody Screen NEG    Sample Expiration      11/12/2021,2359 Performed at [facility], [address] [place], [place] [address]   Urinalysis, Routine w reflex microscopic Urine, Clean Catch     Status: Abnormal   Collection Time: 11/09/21  6:26 PM  Result Value Ref Range   Color, Urine YELLOW YELLOW   APPearance CLEAR CLEAR   Specific Gravity, Urine 1.017 1.005 - 1.030   pH 6.0 5.0 - 8.0   Glucose, UA NEGATIVE NEGATIVE mg/dL   Hgb urine dipstick NEGATIVE NEGATIVE   Bilirubin Urine NEGATIVE NEGATIVE   Ketones, ur NEGATIVE NEGATIVE mg/dL   Protein, ur NEGATIVE NEGATIVE mg/dL   Nitrite NEGATIVE NEGATIVE   Leukocytes,Ua TRACE (A) NEGATIVE   RBC / HPF 0-5 0 - 5 RBC/hpf   WBC, UA 6-10 0 - 5 WBC/hpf   Bacteria, UA NONE SEEN NONE SEEN    Squamous Epithelial / LPF 0-5 0 - 5   Mucus PRESENT      No results found.   MDM Labs ordered and reviewed.   UA, UC CBC with Diff CMP Type and Screen  Saline Lock Dilaudid IV MRI Pelvis w/o contrast  Bladder scan- Foley catheter placed- urine   Dr. Jolayne Panther made aware of abdominal appearance and pending MRI  2323: Dr. Manson Passey from radiology called to discuss results of MRI. MD states there is 25x16cm multiloculated, multiple cystic areas with no solid component, likely ovarian in nature, obscuring most of the field. Reports a small amount of free fluid in the pelvis that could be physiologic in nature. Reports normal appendix, no bowel obstruction or significant stool burden, normal kidneys and cannot comment on ovaries because he does not see normal ovaries. MD states that this imaging would be read in the AM by another team.   2340:  Dr. Jolayne Panther updated on conversation with radiologist. MD will consult with MFM regarding plan of care.  2356: MD will admit to [facility] for pain management and will consult MFM and gyn/onc tomorrow for further management.    Assessment and Plan   1. Ovarian mass   2. [redacted] weeks gestation of pregnancy    -Admit to [facility] -Care turned over to MD   Rolm Bookbinder, CNM 11/09/2021, 8:40 PM

## 2021-11-10 ENCOUNTER — Other Ambulatory Visit: Payer: Self-pay

## 2021-11-10 ENCOUNTER — Inpatient Hospital Stay (HOSPITAL_BASED_OUTPATIENT_CLINIC_OR_DEPARTMENT_OTHER): Payer: Medicaid Other

## 2021-11-10 DIAGNOSIS — Z3A20 20 weeks gestation of pregnancy: Secondary | ICD-10-CM

## 2021-11-10 DIAGNOSIS — R109 Unspecified abdominal pain: Secondary | ICD-10-CM | POA: Diagnosis present

## 2021-11-10 DIAGNOSIS — O99891 Other specified diseases and conditions complicating pregnancy: Secondary | ICD-10-CM

## 2021-11-10 DIAGNOSIS — N83209 Unspecified ovarian cyst, unspecified side: Secondary | ICD-10-CM

## 2021-11-10 DIAGNOSIS — O3482 Maternal care for other abnormalities of pelvic organs, second trimester: Secondary | ICD-10-CM

## 2021-11-10 DIAGNOSIS — N83202 Unspecified ovarian cyst, left side: Secondary | ICD-10-CM

## 2021-11-10 DIAGNOSIS — N83201 Unspecified ovarian cyst, right side: Secondary | ICD-10-CM

## 2021-11-10 DIAGNOSIS — O26892 Other specified pregnancy related conditions, second trimester: Secondary | ICD-10-CM | POA: Diagnosis not present

## 2021-11-10 DIAGNOSIS — O26899 Other specified pregnancy related conditions, unspecified trimester: Secondary | ICD-10-CM | POA: Diagnosis not present

## 2021-11-10 LAB — COMPREHENSIVE METABOLIC PANEL
ALT: 36 U/L (ref 0–44)
AST: 36 U/L (ref 15–41)
Albumin: 2.4 g/dL — ABNORMAL LOW (ref 3.5–5.0)
Alkaline Phosphatase: 85 U/L (ref 38–126)
Anion gap: 9 (ref 5–15)
BUN: 8 mg/dL (ref 6–20)
CO2: 22 mmol/L (ref 22–32)
Calcium: 9.4 mg/dL (ref 8.9–10.3)
Chloride: 104 mmol/L (ref 98–111)
Creatinine, Ser: 0.69 mg/dL (ref 0.44–1.00)
GFR, Estimated: >60 mL/min (ref >60)
Glucose, Bld: 93 mg/dL (ref 70–99)
Potassium: 4.7 mmol/L (ref 3.5–5.1)
Sodium: 135 mmol/L (ref 135–145)
Total Bilirubin: 0.7 mg/dL (ref 0.3–1.2)
Total Protein: 6.2 g/dL — ABNORMAL LOW (ref 6.5–8.1)

## 2021-11-10 LAB — CBC
HCT: 40.9 % (ref 36.0–46.0)
Hemoglobin: 13.5 g/dL (ref 12.0–15.0)
MCH: 30.8 pg (ref 26.0–34.0)
MCHC: 33 g/dL (ref 30.0–36.0)
MCV: 93.2 fL (ref 80.0–100.0)
Platelets: 389 10*3/uL (ref 150–400)
RBC: 4.39 MIL/uL (ref 3.87–5.11)
RDW: 14.1 % (ref 11.5–15.5)
WBC: 15.3 10*3/uL — ABNORMAL HIGH (ref 4.0–10.5)
nRBC: 0 % (ref 0.0–0.2)

## 2021-11-10 LAB — TSH: TSH: 0.795 u[IU]/mL (ref 0.350–4.500)

## 2021-11-10 LAB — HCG, QUANTITATIVE, PREGNANCY: hCG, Beta Chain, Quant, S: >250000 m[IU]/mL — ABNORMAL HIGH (ref <5)

## 2021-11-10 LAB — AFP TUMOR MARKER: AFP, Serum, Tumor Marker: 359 ng/mL — ABNORMAL HIGH (ref 0.0–4.7)

## 2021-11-10 MED ORDER — POLYETHYLENE GLYCOL 3350 17 G PO PACK
17.0000 g | PACK | Freq: Every day | ORAL | Status: DC
  Administered 2021-11-10: 17 g via ORAL
  Filled 2021-11-10: qty 1

## 2021-11-10 MED ORDER — OXYCODONE-ACETAMINOPHEN 5-325 MG PO TABS: 1.0000 | ORAL_TABLET | Freq: Four times a day (QID) | ORAL | 0 refills | Status: DC | PRN

## 2021-11-10 MED ORDER — POLYETHYLENE GLYCOL 3350 17 G PO PACK: 17.0000 g | PACK | Freq: Every day | ORAL | 2 refills | Status: AC | PRN

## 2021-11-10 MED ORDER — BISACODYL 10 MG RE SUPP
10.0000 mg | Freq: Once | RECTAL | Status: AC
  Administered 2021-11-10: 10 mg via RECTAL
  Filled 2021-11-10: qty 1

## 2021-11-10 MED ORDER — DOCUSATE SODIUM 100 MG PO CAPS: 100.0000 mg | ORAL_CAPSULE | Freq: Two times a day (BID) | ORAL | 0 refills | Status: AC

## 2021-11-10 NOTE — Consult Note (Signed)
MFM Note  Eileen Johnson is a gravida 1 para 0 currently at 20 weeks and 4 days.  She was admitted overnight due to significant abdominal pain.  Her pregnancy has been complicated by an unexplained elevated MSAFP of 5.60 MoM and bilateral theca lutein cysts/hyperreactio luteinalis.    She was seen by GYN oncology yesterday who recommended an MRI and repeat AFP level.  She had an MRI performed early this morning that showed a large multiseptated cystic mass versus 2 large subadjacent septated cystic masses within the abdomen and pelvis which measures 26.6 x 15.5 cm.  The cysts are essentially occupying her entire abdomen on top of the gravid uterus.  The patient was given IV Dilaudid overnight and reports that her pain is now tolerable.  She is able to ambulate without difficulty.   On today's ultrasound exam, a viable singleton intrauterine gestation was noted with a normal-appearing posterior placenta.  There was normal amniotic fluid noted.    There were no signs of a molar pregnancy or a twin gestation noted today.  The patient was advised that her significant abdominal pain is most likely related to abdominal distention caused by the large amount of fluid within the cyst.  She understands that these cysts most likely will not resolve until after delivery.    As she is still very early in her pregnancy, she may have to deal with the significant discomfort caused by the cysts for the next few months.  Other than pain medication, there are probably limited amount of treatments for the cysts.  The patient stated that she would like to continue on with her pregnancy.  We will continue to follow her with frequent ultrasounds throughout her pregnancy to assess these cysts and the fetal growth.  As the patient is feeling better, she may be discharged home later this afternoon with oral pain medications.    She will return to our office for another ultrasound in early November as  scheduled.  The patient stated that all of her questions were answered today and she understands everything that was discussed with her today.

## 2021-11-10 NOTE — Progress Notes (Signed)
[facility] OB/[facility]N Attending Note  Patient is Spanish-speaking only, interpreter present for this encounter.  Subjective:  Patient reports improvement in pain since being given pain medications.  Worried about constipation.  No other concerns.  FHR reassuring on doppler, no contractions, no LOF or vaginal bleeding. Good FM.   Admitted on 11/09/2021 for Abdominal pain affecting pregnancy.    Objective:  Blood pressure 122/78, pulse 97, temperature 97.9 F (36.6 C), temperature source Oral, resp. rate 16, height 5\' 2"  (1.575 m), weight 74 kg, last menstrual period 06/13/2021, SpO2 100 %. FHR  150 bpm Gen: NAD HENT: Normocephalic, atraumatic Lungs: Normal respiratory effort Heart: Regular rate noted Abdomen: NT, markedly distended due to ovarian cysts,  gravid fundus, abdomen is overall tense due to enlargement from ovarian cysts Cervix: Deferred Ext: 2+ DTRs, no edema, no cyanosis, negative Homan's sign  Results: Marland Kitchen MR PELVIS WO CONTRAST  Result Date: 11/10/2021 CLINICAL DATA:  21 year old female in second trimester of pregnancy with elevated AFP and recent ultrasound findings of bilateral, enlarged multi-cystic ovaries. EXAM: MRI ABDOMEN AND PELVIS WITHOUT CONTRAST TECHNIQUE: Multiplanar multisequence MR imaging of the abdomen and pelvis was performed. No intravenous contrast was administered. COMPARISON:  None Available. FINDINGS: COMBINED FINDINGS FOR BOTH MR ABDOMEN AND PELVIS Lower chest: No acute findings. Hepatobiliary: No mass or other parenchymal abnormality identified. Pancreas: No mass, inflammatory changes, or other parenchymal abnormality identified. Spleen:  Within normal limits in size and appearance. Adrenals/Urinary Tract: Normal adrenal glands. Kidneys are unremarkable. No mass or hydronephrosis. Urinary bladder is collapsed around a Foley catheter. Stomach/Bowel: Stomach appears normal. Limited evaluation of bowel pathology. No pathologic dilatation of the large  or small bowel loops identified. Moderate retained stool identified within the colon. Vascular/Lymphatic: Normal caliber abdominal aorta. No signs of abdominopelvic adenopathy. Reproductive: Gravid uterus is identified compatible with second trimester gestation. The ovaries are not confidently identified on the current exam. There is a large multi septated cystic mass (versus 2 large subjacent septated cystic masses) within the abdomen and pelvis which measures 26.6 x 15.5 by 29.0 cm (volume = 6260 cm^3), image 32/15 and image 10/20. The areas of internal septation are mostly thin and hairline with the exception of a central area of septation measuring up to 5 mm in thickness. Absence of IV contrast material precludes assessment enhancement characteristics of this cystic mass. No obvious solid mural nodule. Other: There is a small volume of ascites identified within the abdomen and pelvis, image 27/25 and image 22/22. Musculoskeletal: There is mild diffuse body wall edema identified. IMPRESSION: 1. There is a large multi septated cystic mass (versus 2 large subjacent septated cystic masses) within the abdomen and pelvis which measures 26.6 x 15.5 by 29.0 cm (volume = 6260 cm^3). The ovaries are not confidently identified on the current exam. The primary differential consideration is that of a cystic ovarian neoplasm versus hyperreactio luteinalis 2. Normal appearing ovaries are not confidently visualized 3. Small volume of ascites within the abdomen and pelvis. 4. Gravid uterus compatible with second trimester gestation. 5. Moderate retained stool within the colon. Electronically Signed   By: Kerby Moors M.D.   On: 11/10/2021 06:15   MR ABDOMEN WO CONTRAST  Result Date: 11/10/2021 CLINICAL DATA:  21 year old female in second trimester of pregnancy with elevated AFP and recent ultrasound findings of bilateral, enlarged multi-cystic ovaries. EXAM: MRI ABDOMEN AND PELVIS WITHOUT CONTRAST TECHNIQUE: Multiplanar  multisequence MR imaging of the abdomen and pelvis was performed. No intravenous contrast was administered. COMPARISON:  None Available. FINDINGS: COMBINED FINDINGS FOR BOTH MR ABDOMEN AND PELVIS Lower chest: No acute findings. Hepatobiliary: No mass or other parenchymal abnormality identified. Pancreas: No mass, inflammatory changes, or other parenchymal abnormality identified. Spleen:  Within normal limits in size and appearance. Adrenals/Urinary Tract: Normal adrenal glands. Kidneys are unremarkable. No mass or hydronephrosis. Urinary bladder is collapsed around a Foley catheter. Stomach/Bowel: Stomach appears normal. Limited evaluation of bowel pathology. No pathologic dilatation of the large or small bowel loops identified. Moderate retained stool identified within the colon. Vascular/Lymphatic: Normal caliber abdominal aorta. No signs of abdominopelvic adenopathy. Reproductive: Gravid uterus is identified compatible with second trimester gestation. The ovaries are not confidently identified on the current exam. There is a large multi septated cystic mass (versus 2 large subjacent septated cystic masses) within the abdomen and pelvis which measures 26.6 x 15.5 by 29.0 cm (volume = 6260 cm^3), image 32/15 and image 10/20. The areas of internal septation are mostly thin and hairline with the exception of a central area of septation measuring up to 5 mm in thickness. Absence of IV contrast material precludes assessment enhancement characteristics of this cystic mass. No obvious solid mural nodule. Other: There is a small volume of ascites identified within the abdomen and pelvis, image 27/25 and image 22/22. Musculoskeletal: There is mild diffuse body wall edema identified. IMPRESSION: 1. There is a large multi septated cystic mass (versus 2 large subjacent septated cystic masses) within the abdomen and pelvis which measures 26.6 x 15.5 by 29.0 cm (volume = 6260 cm^3). The ovaries are not confidently identified on  the current exam. The primary differential consideration is that of a cystic ovarian neoplasm versus hyperreactio luteinalis 2. Normal appearing ovaries are not confidently visualized 3. Small volume of ascites within the abdomen and pelvis. 4. Gravid uterus compatible with second trimester gestation. 5. Moderate retained stool within the colon. Electronically Signed   By: Kerby Moors M.D.   On: 11/10/2021 06:15   [place] MFM OB DETAIL +14 WK  Result Date: 10/29/2021 ----------------------------------------------------------------------  OBSTETRICS REPORT                    (Corrected Final 10/29/2021 04:32 pm) ---------------------------------------------------------------------- Patient Info  ID #:       [id number]                          D.O.B.:  [birth year] (20 yrs)  Name:       Eileen Johnson            Visit Date: 10/28/2021 09:32 am ---------------------------------------------------------------------- Performed By  Attending:        Valeda Malm DO       Secondary Phy.:   [place] CNM  Performed By:     Nathen May       Address:          [address], [place]  Referred By:      [facility]             Location:         [facility]                                                             [place] [facility]                                                             [facility]  Ref. Address:     [place]                    [place]                    [place] ---------------------------------------------------------------------- Orders  #  Description                           Code        Ordered By  1  [place] MFM OB DETAIL +14 [place]                [id number]    LISA LEFTWICH-                                                       Baltazar Najjar ----------------------------------------------------------------------  #  Order #                     Accession #                Episode #  1  [id number]                   [id number]                 [id number] ---------------------------------------------------------------------- Indications  Elevated MSAFP (5.60 MoM)  O28.9  Encounter for antenatal screening for          Z36.3  malformations  [redacted] weeks gestation of pregnancy                Z3A.18  LR NIPS - Female, Negative Horizon  Bilateral adnexal masses - HL                  O35.8XX0 ---------------------------------------------------------------------- Vital Signs  BP:          111/73 ---------------------------------------------------------------------- Fetal Evaluation  Num Of Fetuses:         1  Fetal Heart Rate(bpm):  154  Cardiac Activity:       Observed  Presentation:           Cephalic  Placenta:               Posterior  P. Cord Insertion:      Visualized, central  Amniotic Fluid  AFI FV:      Within normal limits                              Largest Pocket(cm)                              7.4 ---------------------------------------------------------------------- Biometry  BPD:      42.2  mm     G. Age:  18w 5d         54  %    CI:        72.93   %    70 - 86                                                          FL/HC:      15.8   %    16.1 - 18.3  HC:      157.1  mm     G. Age:  18w 4d         37  %    HC/AC:      1.18        1.09 - 1.39  AC:      133.1  mm     G. Age:  18w 6d         49  %    FL/BPD:     58.8   %  FL:       24.8  mm     G. Age:  17w 3d          9  %    FL/AC:      18.6   %    20 - 24  HUM:      24.9  mm     G. Age:  17w 6d         25  %  CER:      19.1  mm     G. Age:  18w 5d         39  %  NFT:       2.6  mm  CM:        3.9  mm  Est. FW:     231  gm  0 lb 8 oz     21  %  ---------------------------------------------------------------------- OB History  Gravidity:    1         Term:   0        Prem:   0        SAB:   0  TOP:          0       Ectopic:  0        Living: 0 ---------------------------------------------------------------------- Gestational Age  LMP:           19w 4d        Date:  06/13/21                  EDD:   03/20/22  U/S Today:     18w 3d                                        EDD:   03/28/22  Best:          18w 5d     Det. ByMarcella Dubs         EDD:   03/26/22                                      (08/25/21) ---------------------------------------------------------------------- Anatomy  Cranium:               Appears normal         LVOT:                   Not well visualized  Cavum:                 Appears normal         Aortic Arch:            Not well visualized  Ventricles:            Not well visualized    Ductal Arch:            Not well visualized  Choroid Plexus:        Appears normal         Diaphragm:              Appears normal  Cerebellum:            Appears normal         Stomach:                Appears normal, left                                                                        sided  Posterior Fossa:       Appears normal         Abdomen:                Appears normal  Nuchal Fold:           Appears normal         Abdominal Wall:  Appears nml (cord                                                                        insert, abd wall)  Face:                  Appears normal         Cord Vessels:           Appears normal (3                         (orbits and profile)                           vessel cord)  Lips:                  Not well visualized    Kidneys:                Appear normal  Palate:                Not well visualized    Bladder:                Appears normal  Thoracic:              Appears normal         Spine:                  Not well visualized  Heart:                 Not well visualized    Upper Extremities:       Appears normal  RVOT:                  Not well visualized    Lower Extremities:      Appears normal  Other:  Fetus appears to be a female. Heels/feet and open hands/5th digits,          nasal bone, lenses visualized. ---------------------------------------------------------------------- Cervix Uterus Adnexa  Cervix  Length:            2.5  cm.  Closed ---------------------------------------------------------------------- Comments  [name] is a G5P3 at [place] with EDD of  03/26/2022 dated by Early Ultrasound  (08/25/21) here for a  detailed anatomic survey for bilateral adnexal masses. She  had low risk NIPS and an elevated AFP.  Sonographic findings  Single intrauterine pregnancy.  Observed fetal cardiac activity.  Cephalic presentation.  Fetal anatomy that was well seen appears normal without  evidence of soft markers. Not all fetal structures were well  seen due to a technically diffcult exam and the anatomic  survey remains incomplete with limited views of the nose/lips,  palate, heart, lateral vents and spine  Fetal biometry shows the estimated fetal weight at the 21  percentile.  Amniotic fluid volume: Within normal limits.  Placenta: Posterior.  Cervix: Closed with a cervical length of 2.5 cm.  Adnexa: Bilateral adnexal masses with multiple small cysts in  a spoke-wheel pattern consistent with hyperreactio luteinalis.  No abdominal ascites.  I discussed the limitations of prenatal ultrasound with the  patient  including inability to detect certain abnormalities and  poor visualization of certain anatomic structures due to fetal  position, gestational age and maternal body habitus.  The  patient verbalized understanding and had time to ask  questions that were answered to her satisfaction.  MFM Consult Note  Patient Name: Eileen Johnson  Patient MRN:   [id number]  Referring provider: Merla Riches  Reason for Consult: Bilateral adnexal masses  HPI: AZARIE MARGOLIS is a 21 y.o. G1P0 at [redacted]w[redacted]d  here  for ultrasound and consultation.  The patient has no complaints today and was not aware of  these bilateral adnexal masses until today's sonogram.  She  denies pelvic pain, cramping, nausea, vomiting or changes in  bowel movements.  Hyperreactio luteinalis  Today's ultrasound is concerning for bilateral adnexal masses  representative of hyperreactio luteinalis (HL). Sonographically  there are characteristic "spoke wheel" appearance of each  ovary due to the presence of multiple small cysts. I discussed  this is an uncommon benign condition characterized by  bilateral multicystic ovarian enlargements. It can also be  associated with elevated serum androgen levels resulting in  female pattern hair growth and deepening of the voice.  The  etiology of hyperreactio luteinalis (HL) is unknown but is  thought to be associated with production of high  concentrations of hCG and increased ovarian sensitivity  manifest as an exaggerated ovarian response leading to  theca lutein cyst formation. It can be associated with very  high steroid concentrations and is very rare in the first  trimester. Elevated serum androgens may result in voice  changes (hoarseness), as well as female pattern of hair  distribution. It is not associated with trophoblastic disease.  Depending on the size of the masses, which may be as a  result of moderate-to-marked cystic ovarian enlargement,  patients can present with complications like torsion, pressure  symptoms, or intracystic hemorrhage. Treatment is  dependent on symptoms, with abdominal pain and ovarian  torsion being the main emergency situations. The ovarian  cysts usually regress spontaneously without any medical  intervention after delivery in the nonemergent situation.  However she also has an elevated AFP which is a tumor  marker and since there is no evidence of a fetal cause of this.  Based on a review of the existing literature there is no well-  documented association between HDL and an  elevated AFP.  Typically androgens can be elevated but not AFP.  She will  be referred to a gynecology oncologist.  Review of Systems: A review of systems was performed and  was negative except per HPI  Vitals and Physical Exam  See intake sheet for vitals  Sitting comfortably on the sonogram table  Nonlabored breathing  Normal rate and rhythm  Abdomen is nontender  Genetic testing: Normal cell free DNA on panorama, elevated  AFP  I discussed the limitations of prenatal ultrasound with the  patient including inability to detect certain abnormalities and  poor visualization of certain anatomic structures due to fetal  position, gestational age and maternal body habitus.  The  patient verbalized understanding and had time to ask  questions that were answered to her satisfaction.  Assessment  - Bilateral adnexal masses, suggestive of hyperreactio  luteinalis  - Elevated AFP, no identifiable fetal cause  Plan  - Continue to follow-up with growth ultrasounds  - Precautions given about ovarian torsion  - Due to elevated AFP and no evidence of NTD or abdominal  wall defect in the setting of  adnexal masses, I will refer her to  gyn oncology. A secure chat message has been sent to Dr.  Jeral Pinch.  - If these are HL, then there is no need for surgery since they  usually regress postpartum ----------------------------------------------------------------------                       Valeda Malm, DO Electronically Signed Corrected Final Report  10/29/2021 04:32 pm ----------------------------------------------------------------------   Assessment & Plan:  21 y.o. G1P0 at [redacted]w[redacted]d admitted for pain management due to markedly enlarged cysts.  Pain is better now.  Already been seen by MFM and GYN ONC, appreciate their input.   - Will do other lab evaluation as recommended after phone conversation with Dr. Berline Lopes (HCG, testosterone, TSH). - Continue pain control - Miralax and Dulcolax ordered for constipation -  Likely  discharge later today.  Continue close observation.   Verita Schneiders, MD, [place] [facility] [employer], [place]

## 2021-11-10 NOTE — Discharge Summary (Signed)
Antenatal Physician Discharge Summary  Patient ID: Eileen Johnson MRN: [id number] DOB/AGE: 02/05/2000 21 y.o.  Admit date: 11/09/2021 Discharge date: 11/10/2021  Admission Diagnoses: Principal Problem:   Abdominal pain during pregnancy, second trimester Active Problems:   Bilateral Large Adnexal Masses   [redacted] weeks gestation of pregnancy   Abdominal pain affecting pregnancy  Discharge Diagnoses:  Th same  Prenatal Procedures: MR of pelvis, MFM ultrasound  Consults: Gynecology Oncology, Maternal Fetal Medicine  Hospital Course:  Eileen Johnson is a 21 y.o. G1P0 with IUP at [redacted]w[redacted]d admitted for pain management in the setting of very large bilateral, multiloculated adnexal masses.  Had been evaluated by Dr. Pricilla Holm (GYN Oncology) on the day of admission. Patient had known elevated AFP, follow up AFP and other tumor markers pending.  She was treated with pain medications which alleviated her pain during admission.  MR of pelvis was done, see results below. Upon review of imaging by Dr. 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  No surgical intervention needed for now, pain management recommended. Patient was also seen by Dr. Parke Poisson (MFM), no issues with pregnancy noted, and he agreed with Dr. Winferd Humphrey  recommendations. Of note, patient did have a urinary foley catheter placed temporarily due to concern about urinary retention, but she was voiding without difficulty after it was removed. Patient was also treated for constipation.  She had no signs/symptoms of preterm labor or other maternal-fetal concerns.   She was deemed stable for discharge to home with outpatient follow up.  Discharge Exam: Temp:  [97.9 F (36.6 C)-98.4 F (36.9 C)] 98.1 F (36.7 C) (10/25 1207) Pulse Rate:  [91-117] 99 (10/25 1207) Resp:  [16-18] 16 (10/25 1207) BP: (117-133)/(72-89) 117/79 (10/25 1207) SpO2:  [99 %-100 %] 100 % (10/25  1207) Weight:  [73.7 kg-74 kg] 74 kg (10/24 1752) FHR by Doppler: 155 bpm Physical Examination: CONSTITUTIONAL: Well-developed, well-nourished female in no acute distress.  HENT:  Normocephalic, atraumatic, External right and left ear normal.  EYES: Conjunctivae and EOM are normal. Pupils are equal, round, and reactive to light. No scleral icterus.  NECK: Normal range of motion, supple, no masses SKIN: Skin is warm and dry. No rash noted. Not diaphoretic. No erythema. No pallor. NEUROLOGIC: Alert and oriented to person, place, and time. Normal reflexes, muscle tone coordination. No cranial nerve deficit noted. PSYCHIATRIC: Normal mood and affect. Normal behavior. Normal judgment and thought content. CARDIOVASCULAR: Normal heart rate noted, regular rhythm RESPIRATORY: Effort and breath sounds normal, no problems with respiration noted MUSCULOSKELETAL: Normal range of motion. No edema and no tenderness. 2+ distal pulses. ABDOMEN: Soft, nontender, gravid. Markedly distended due to ovarian cysts, abdomen is overall tense due to enlargement from ovarian cysts CERVIX:  Deferred  Significant Diagnostic Studies:  Results for orders placed or performed during the hospital encounter of 11/09/21 (from the past 168 hour(s))  CBC with Differential/Platelet   Collection Time: 11/09/21  6:26 PM  Result Value Ref Range   WBC 12.3 (H) 4.0 - 10.5 K/uL   RBC 4.15 3.87 - 5.11 MIL/uL   Hemoglobin 13.1 12.0 - 15.0 g/dL   HCT 19.1 47.8 - 29.5 %   MCV 91.8 80.0 - 100.0 fL   MCH 31.6 26.0 - 34.0 pg   MCHC 34.4 30.0 - 36.0 g/dL   RDW 62.1 30.8 - 65.7 %   Platelets 387 150 - 400 K/uL   nRBC 0.0 0.0 - 0.2 %   Neutrophils Relative %  74 %   Neutro Abs 9.2 (H) 1.7 - 7.7 K/uL   Lymphocytes Relative 18 %   Lymphs Abs 2.2 0.7 - 4.0 K/uL   Monocytes Relative 7 %   Monocytes Absolute 0.8 0.1 - 1.0 K/uL   Eosinophils Relative 0 %   Eosinophils Absolute 0.0 0.0 - 0.5 K/uL   Basophils Relative 1 %   Basophils  Absolute 0.1 0.0 - 0.1 K/uL   Immature Granulocytes 0 %   Abs Immature Granulocytes 0.04 0.00 - 0.07 K/uL  Comprehensive metabolic panel   Collection Time: 11/09/21  6:26 PM  Result Value Ref Range   Sodium 137 135 - 145 mmol/L   Potassium 4.0 3.5 - 5.1 mmol/L   Chloride 106 98 - 111 mmol/L   CO2 23 22 - 32 mmol/L   Glucose, Bld 98 70 - 99 mg/dL   BUN 6 6 - 20 mg/dL   Creatinine, Ser 6.76 0.44 - 1.00 mg/dL   Calcium 9.1 8.9 - 19.5 mg/dL   Total Protein 6.0 (L) 6.5 - 8.1 g/dL   Albumin 2.4 (L) 3.5 - 5.0 g/dL   AST 37 15 - 41 U/L   ALT 35 0 - 44 U/L   Alkaline Phosphatase 89 38 - 126 U/L   Total Bilirubin 0.4 0.3 - 1.2 mg/dL   GFR, Estimated >09 >32 mL/min   Anion gap 8 5 - 15  Urinalysis, Routine w reflex microscopic Urine, Clean Catch   Collection Time: 11/09/21  6:26 PM  Result Value Ref Range   Color, Urine YELLOW YELLOW   APPearance CLEAR CLEAR   Specific Gravity, Urine 1.017 1.005 - 1.030   pH 6.0 5.0 - 8.0   Glucose, UA NEGATIVE NEGATIVE mg/dL   Hgb urine dipstick NEGATIVE NEGATIVE   Bilirubin Urine NEGATIVE NEGATIVE   Ketones, ur NEGATIVE NEGATIVE mg/dL   Protein, ur NEGATIVE NEGATIVE mg/dL   Nitrite NEGATIVE NEGATIVE   Leukocytes,Ua TRACE (A) NEGATIVE   RBC / HPF 0-5 0 - 5 RBC/hpf   WBC, UA 6-10 0 - 5 WBC/hpf   Bacteria, UA NONE SEEN NONE SEEN   Squamous Epithelial / LPF 0-5 0 - 5   Mucus PRESENT   Type and screen [facility]   Collection Time: 11/09/21  6:26 PM  Result Value Ref Range   ABO/RH(D) O POS    Antibody Screen NEG    Sample Expiration      11/12/2021,2359 Performed at [facility], [address] [place], [place] [address]   CBC   Collection Time: 11/10/21  5:10 AM  Result Value Ref Range   WBC 15.3 (H) 4.0 - 10.5 K/uL   RBC 4.39 3.87 - 5.11 MIL/uL   Hemoglobin 13.5 12.0 - 15.0 g/dL   HCT 58.0 99.8 - 33.8 %   MCV 93.2 80.0 - 100.0 fL   MCH 30.8 26.0 - 34.0 pg   MCHC 33.0 30.0 - 36.0 g/dL   RDW 25.0 53.9 - 76.7 %    Platelets 389 150 - 400 K/uL   nRBC 0.0 0.0 - 0.2 %  Comprehensive metabolic panel   Collection Time: 11/10/21  5:10 AM  Result Value Ref Range   Sodium 135 135 - 145 mmol/L   Potassium 4.7 3.5 - 5.1 mmol/L   Chloride 104 98 - 111 mmol/L   CO2 22 22 - 32 mmol/L   Glucose, Bld 93 70 - 99 mg/dL   BUN 8 6 - 20 mg/dL   Creatinine, Ser  0.69 0.44 - 1.00 mg/dL   Calcium 9.4 8.9 - 64.3 mg/dL   Total Protein 6.2 (L) 6.5 - 8.1 g/dL   Albumin 2.4 (L) 3.5 - 5.0 g/dL   AST 36 15 - 41 U/L   ALT 36 0 - 44 U/L   Alkaline Phosphatase 85 38 - 126 U/L   Total Bilirubin 0.7 0.3 - 1.2 mg/dL   GFR, Estimated >32 >95 mL/min   Anion gap 9 5 - 15  hCG, quantitative, pregnancy   Collection Time: 11/10/21  5:10 AM  Result Value Ref Range   hCG, Beta Chain, Quant, S >250,000 (H) <5 mIU/mL  TSH   Collection Time: 11/10/21 11:12 AM  Result Value Ref Range   TSH 0.795 0.350 - 4.500 uIU/mL   MR PELVIS WO CONTRAST  Result Date: 11/10/2021 CLINICAL DATA:  21 year old female in second trimester of pregnancy with elevated AFP and recent ultrasound findings of bilateral, enlarged multi-cystic ovaries. EXAM: MRI ABDOMEN AND PELVIS WITHOUT CONTRAST TECHNIQUE: Multiplanar multisequence MR imaging of the abdomen and pelvis was performed. No intravenous contrast was administered. COMPARISON:  None Available. FINDINGS: COMBINED FINDINGS FOR BOTH MR ABDOMEN AND PELVIS Lower chest: No acute findings. Hepatobiliary: No mass or other parenchymal abnormality identified. Pancreas: No mass, inflammatory changes, or other parenchymal abnormality identified. Spleen:  Within normal limits in size and appearance. Adrenals/Urinary Tract: Normal adrenal glands. Kidneys are unremarkable. No mass or hydronephrosis. Urinary bladder is collapsed around a Foley catheter. Stomach/Bowel: Stomach appears normal. Limited evaluation of bowel pathology. No pathologic dilatation of the large or small bowel loops identified. Moderate retained stool  identified within the colon. Vascular/Lymphatic: Normal caliber abdominal aorta. No signs of abdominopelvic adenopathy. Reproductive: Gravid uterus is identified compatible with second trimester gestation. The ovaries are not confidently identified on the current exam. There is a large multi septated cystic mass (versus 2 large subjacent septated cystic masses) within the abdomen and pelvis which measures 26.6 x 15.5 by 29.0 cm (volume = 6260 cm^3), image 32/15 and image 10/20. The areas of internal septation are mostly thin and hairline with the exception of a central area of septation measuring up to 5 mm in thickness. Absence of IV contrast material precludes assessment enhancement characteristics of this cystic mass. No obvious solid mural nodule. Other: There is a small volume of ascites identified within the abdomen and pelvis, image 27/25 and image 22/22. Musculoskeletal: There is mild diffuse body wall edema identified. IMPRESSION: 1. There is a large multi septated cystic mass (versus 2 large subjacent septated cystic masses) within the abdomen and pelvis which measures 26.6 x 15.5 by 29.0 cm (volume = 6260 cm^3). The ovaries are not confidently identified on the current exam. The primary differential consideration is that of a cystic ovarian neoplasm versus hyperreactio luteinalis 2. Normal appearing ovaries are not confidently visualized 3. Small volume of ascites within the abdomen and pelvis. 4. Gravid uterus compatible with second trimester gestation. 5. Moderate retained stool within the colon. Electronically Signed   By: Signa Kell M.D.   On: 11/10/2021 06:15   MR ABDOMEN WO CONTRAST  Result Date: 11/10/2021 CLINICAL DATA:  21 year old female in second trimester of pregnancy with elevated AFP and recent ultrasound findings of bilateral, enlarged multi-cystic ovaries. EXAM: MRI ABDOMEN AND PELVIS WITHOUT CONTRAST TECHNIQUE: Multiplanar multisequence MR imaging of the abdomen and pelvis was  performed. No intravenous contrast was administered. COMPARISON:  None Available. FINDINGS: COMBINED FINDINGS FOR BOTH MR ABDOMEN AND PELVIS Lower chest: No acute findings. Hepatobiliary:  No mass or other parenchymal abnormality identified. Pancreas: No mass, inflammatory changes, or other parenchymal abnormality identified. Spleen:  Within normal limits in size and appearance. Adrenals/Urinary Tract: Normal adrenal glands. Kidneys are unremarkable. No mass or hydronephrosis. Urinary bladder is collapsed around a Foley catheter. Stomach/Bowel: Stomach appears normal. Limited evaluation of bowel pathology. No pathologic dilatation of the large or small bowel loops identified. Moderate retained stool identified within the colon. Vascular/Lymphatic: Normal caliber abdominal aorta. No signs of abdominopelvic adenopathy. Reproductive: Gravid uterus is identified compatible with second trimester gestation. The ovaries are not confidently identified on the current exam. There is a large multi septated cystic mass (versus 2 large subjacent septated cystic masses) within the abdomen and pelvis which measures 26.6 x 15.5 by 29.0 cm (volume = 6260 cm^3), image 32/15 and image 10/20. The areas of internal septation are mostly thin and hairline with the exception of a central area of septation measuring up to 5 mm in thickness. Absence of IV contrast material precludes assessment enhancement characteristics of this cystic mass. No obvious solid mural nodule. Other: There is a small volume of ascites identified within the abdomen and pelvis, image 27/25 and image 22/22. Musculoskeletal: There is mild diffuse body wall edema identified. IMPRESSION: 1. There is a large multi septated cystic mass (versus 2 large subjacent septated cystic masses) within the abdomen and pelvis which measures 26.6 x 15.5 by 29.0 cm (volume = 6260 cm^3). The ovaries are not confidently identified on the current exam. The primary differential  consideration is that of a cystic ovarian neoplasm versus hyperreactio luteinalis 2. Normal appearing ovaries are not confidently visualized 3. Small volume of ascites within the abdomen and pelvis. 4. Gravid uterus compatible with second trimester gestation. 5. Moderate retained stool within the colon. Electronically Signed   By: Signa Kell M.D.   On: 11/10/2021 06:15   [place] MFM OB DETAIL +14 WK  Result Date: 10/29/2021 ----------------------------------------------------------------------  OBSTETRICS REPORT                    (Corrected Final 10/29/2021 04:32 pm) ---------------------------------------------------------------------- Patient Info  ID #:       [id number]                          D.O.B.:  Aug 07, 2000 (20 yrs)  Name:       Jeni Salles            Visit Date: 10/28/2021 09:32 am ---------------------------------------------------------------------- Performed By  Attending:        Braxton Feathers DO       Secondary Phy.:   LISA A LEFTWICH-                                                             KIRBY CNM  Performed By:     Marcellina Millin       Address:          [address]                    [place]  [place], [place]                                                             [address]  Referred By:      [facility]             Location:         [facility]  Ref. Address:     [address]                    [address]                    [place]                    [address] ---------------------------------------------------------------------- Orders  #  Description                           Code        Ordered By  1  [place] MFM OB DETAIL +14 WK               [id number]    LISA LEFTWICH-                                                        Craige Cotta ----------------------------------------------------------------------  #  Order #                     Accession #                Episode #  1  [id number]                   [id number]                 [id number] ---------------------------------------------------------------------- Indications  Elevated MSAFP (5.60 MoM)  O28.9  Encounter for antenatal screening for          Z36.3  malformations  [redacted] weeks gestation of pregnancy                Z3A.18  LR NIPS - Female, Negative Horizon  Bilateral adnexal masses - HL                  O35.8XX0 ---------------------------------------------------------------------- Vital Signs  BP:          111/73 ---------------------------------------------------------------------- Fetal Evaluation  Num Of Fetuses:         1  Fetal Heart Rate(bpm):  154  Cardiac Activity:       Observed  Presentation:           Cephalic  Placenta:               Posterior  P. Cord Insertion:      Visualized, central  Amniotic Fluid  AFI FV:      Within normal limits                              Largest Pocket(cm)                              7.4 ---------------------------------------------------------------------- Biometry  BPD:      42.2  mm     G. Age:  18w 5d         54  %    CI:        72.93   %    70 - 86                                                          FL/HC:      15.8   %    16.1 - 18.3  HC:      157.1  mm     G. Age:  18w 4d         37  %    HC/AC:      1.18        1.09 - 1.39  AC:      133.1  mm     G. Age:  18w 6d         49  %    FL/BPD:     58.8   %  FL:       24.8  mm     G. Age:  17w 3d          9  %    FL/AC:      18.6   %    20 - 24  HUM:      24.9  mm     G. Age:  17w 6d         25  %  CER:      19.1  mm     G. Age:  18w 5d         39  %  NFT:       2.6  mm  CM:        3.9  mm  Est. FW:     231  gm  0 lb 8 oz     21  % ---------------------------------------------------------------------- OB History  Gravidity:    1          Term:   0        Prem:   0        SAB:   0  TOP:          0       Ectopic:  0        Living: 0 ---------------------------------------------------------------------- Gestational Age  LMP:           19w 4d        Date:  06/13/21                  EDD:   03/20/22  U/S Today:     18w 3d                                        EDD:   03/28/22  Best:          18w 5d     Det. ByLoman Chroman         EDD:   03/26/22                                      (08/25/21) ---------------------------------------------------------------------- Anatomy  Cranium:               Appears normal         LVOT:                   Not well visualized  Cavum:                 Appears normal         Aortic Arch:            Not well visualized  Ventricles:            Not well visualized    Ductal Arch:            Not well visualized  Choroid Plexus:        Appears normal         Diaphragm:              Appears normal  Cerebellum:            Appears normal         Stomach:                Appears normal, left                                                                        sided  Posterior Fossa:       Appears normal         Abdomen:                Appears normal  Nuchal Fold:           Appears normal         Abdominal Wall:  Appears nml (cord                                                                        insert, abd wall)  Face:                  Appears normal         Cord Vessels:           Appears normal (3                         (orbits and profile)                           vessel cord)  Lips:                  Not well visualized    Kidneys:                Appear normal  Palate:                Not well visualized    Bladder:                Appears normal  Thoracic:              Appears normal         Spine:                  Not well visualized  Heart:                 Not well visualized    Upper Extremities:      Appears normal  RVOT:                  Not well visualized    Lower Extremities:      Appears normal   Other:  Fetus appears to be a female. Heels/feet and open hands/5th digits,          nasal bone, lenses visualized. ---------------------------------------------------------------------- Cervix Uterus Adnexa  Cervix  Length:            2.5  cm.  Closed ---------------------------------------------------------------------- Comments  Ms. Daenerys Buttram is a G5P3 at 18w 5d with EDD of  03/26/2022 dated by Early Ultrasound  (08/25/21) here for a  detailed anatomic survey for bilateral adnexal masses. She  had low risk NIPS and an elevated AFP.  Sonographic findings  Single intrauterine pregnancy.  Observed fetal cardiac activity.  Cephalic presentation.  Fetal anatomy that was well seen appears normal without  evidence of soft markers. Not all fetal structures were well  seen due to a technically diffcult exam and the anatomic  survey remains incomplete with limited views of the nose/lips,  palate, heart, lateral vents and spine  Fetal biometry shows the estimated fetal weight at the 21  percentile.  Amniotic fluid volume: Within normal limits.  Placenta: Posterior.  Cervix: Closed with a cervical length of 2.5 cm.  Adnexa: Bilateral adnexal masses with multiple small cysts in  a spoke-wheel pattern consistent with hyperreactio luteinalis.  No abdominal ascites.  I discussed the limitations of prenatal ultrasound with the  patient  including inability to detect certain abnormalities and  poor visualization of certain anatomic structures due to fetal  position, gestational age and maternal body habitus.  The  patient verbalized understanding and had time to ask  questions that were answered to her satisfaction.  MFM Consult Note  Patient Name: TAHJAE DURR  Patient MRN:   [id number]  Referring provider: Haskel Khan  Reason for Consult: Bilateral adnexal masses  HPI: VALETTA MULROY is a 21 y.o. G1P0 at [redacted]w[redacted]d  here for ultrasound and consultation.  The patient has no complaints today and was not aware of  these  bilateral adnexal masses until today's sonogram.  She  denies pelvic pain, cramping, nausea, vomiting or changes in  bowel movements.  Hyperreactio luteinalis  Today's ultrasound is concerning for bilateral adnexal masses  representative of hyperreactio luteinalis (HL). Sonographically  there are characteristic "spoke wheel" appearance of each  ovary due to the presence of multiple small cysts. I discussed  this is an uncommon benign condition characterized by  bilateral multicystic ovarian enlargements. It can also be  associated with elevated serum androgen levels resulting in  female pattern hair growth and deepening of the voice.  The  etiology of hyperreactio luteinalis (HL) is unknown but is  thought to be associated with production of high  concentrations of hCG and increased ovarian sensitivity  manifest as an exaggerated ovarian response leading to  theca lutein cyst formation. It can be associated with very  high steroid concentrations and is very rare in the first  trimester. Elevated serum androgens may result in voice  changes (hoarseness), as well as female pattern of hair  distribution. It is not associated with trophoblastic disease.  Depending on the size of the masses, which may be as a  result of moderate-to-marked cystic ovarian enlargement,  patients can present with complications like torsion, pressure  symptoms, or intracystic hemorrhage. Treatment is  dependent on symptoms, with abdominal pain and ovarian  torsion being the main emergency situations. The ovarian  cysts usually regress spontaneously without any medical  intervention after delivery in the nonemergent situation.  However she also has an elevated AFP which is a tumor  marker and since there is no evidence of a fetal cause of this.  Based on a review of the existing literature there is no well-  documented association between HDL and an elevated AFP.  Typically androgens can be elevated but not AFP.  She will  be referred to a  gynecology oncologist.  Review of Systems: A review of systems was performed and  was negative except per HPI  Vitals and Physical Exam  See intake sheet for vitals  Sitting comfortably on the sonogram table  Nonlabored breathing  Normal rate and rhythm  Abdomen is nontender  Genetic testing: Normal cell free DNA on panorama, elevated  AFP  I discussed the limitations of prenatal ultrasound with the  patient including inability to detect certain abnormalities and  poor visualization of certain anatomic structures due to fetal  position, gestational age and maternal body habitus.  The  patient verbalized understanding and had time to ask  questions that were answered to her satisfaction.  Assessment  - Bilateral adnexal masses, suggestive of hyperreactio  luteinalis  - Elevated AFP, no identifiable fetal cause  Plan  - Continue to follow-up with growth ultrasounds  - Precautions given about ovarian torsion  - Due to elevated AFP and no evidence of NTD or abdominal  wall defect in the setting of  adnexal masses, I will refer her to  gyn oncology. A secure chat message has been sent to Dr.  Eugene GarnetKatherine Tucker.  - If these are HL, then there is no need for surgery since they  usually regress postpartum ----------------------------------------------------------------------                       Braxton FeathersBurk Schaible, DO Electronically Signed Corrected Final Report  10/29/2021 04:32 pm ----------------------------------------------------------------------   Future Appointments  Date Time Provider Department Center  11/17/2021  3:00 PM [facility] 1 [facility] [place]  11/25/2021  1:30 PM [facility]  11/25/2021  1:45 PM [facility]  12/08/2021  2:50 PM Gerrit HeckEmly, Jessica, CNM [facility] None  12/23/2021  1:30 PM [facility]  12/23/2021  1:45 PM [facility]    Discharge Condition: Stable   Allergies as of 11/10/2021   No Known Allergies      Medication List     TAKE these  medications    docusate sodium 100 MG capsule Commonly known as: COLACE Take 1 capsule (100 mg total) by mouth 2 (two) times daily.   oxyCODONE-acetaminophen 5-325 MG tablet Commonly known as: PERCOCET/ROXICET Take 1 tablet by mouth every 6 (six) hours as needed for moderate pain or severe pain.   polyethylene glycol 17 g packet Commonly known as: MIRALAX / GLYCOLAX Take 17 g by mouth daily as needed for moderate constipation or severe constipation.   PRENATAL GUMMIES PO Take by mouth.         Total discharge time: 20 minutes   Signed: Jaynie CollinsUgonna Genine Beckett M.D. 11/10/2021, 3:11 PM

## 2021-11-11 LAB — CERVICOVAGINAL ANCILLARY ONLY
Chlamydia: POSITIVE — AB
Comment: NEGATIVE
Comment: NEGATIVE
Comment: NORMAL
Neisseria Gonorrhea: NEGATIVE
Trichomonas: NEGATIVE

## 2021-11-11 LAB — GC/CHLAMYDIA PROBE AMP (~~LOC~~) NOT AT ARMC
Chlamydia: NEGATIVE
Comment: NEGATIVE
Comment: NORMAL
Neisseria Gonorrhea: NEGATIVE

## 2021-11-11 LAB — CULTURE, OB URINE: Culture: NO GROWTH

## 2021-11-11 LAB — TESTOSTERONE: Testosterone: >1500 ng/dL — ABNORMAL HIGH (ref 13–71)

## 2021-11-12 ENCOUNTER — Encounter (HOSPITAL_COMMUNITY): Payer: Self-pay | Admitting: Family Medicine

## 2021-11-12 ENCOUNTER — Inpatient Hospital Stay (HOSPITAL_COMMUNITY)
Admission: AD | Admit: 2021-11-12 | Discharge: 2021-11-12 | Disposition: A | Discharge status: Home / Self Care | Payer: Medicaid Other | Attending: Obstetrics & Gynecology | Admitting: Obstetrics & Gynecology

## 2021-11-12 DIAGNOSIS — N9489 Other specified conditions associated with female genital organs and menstrual cycle: Secondary | ICD-10-CM | POA: Diagnosis not present

## 2021-11-12 DIAGNOSIS — R1010 Upper abdominal pain, unspecified: Secondary | ICD-10-CM | POA: Insufficient documentation

## 2021-11-12 DIAGNOSIS — Z3A20 20 weeks gestation of pregnancy: Secondary | ICD-10-CM | POA: Insufficient documentation

## 2021-11-12 DIAGNOSIS — O26892 Other specified pregnancy related conditions, second trimester: Secondary | ICD-10-CM | POA: Insufficient documentation

## 2021-11-12 MED ORDER — OXYCODONE-ACETAMINOPHEN 5-325 MG PO TABS
2.0000 | ORAL_TABLET | Freq: Once | ORAL | Status: AC
  Administered 2021-11-12: 2 via ORAL
  Filled 2021-11-12: qty 2

## 2021-11-12 MED ORDER — IBUPROFEN 800 MG PO TABS: 800.0000 mg | ORAL_TABLET | Freq: Three times a day (TID) | ORAL | 0 refills | Status: DC

## 2021-11-12 MED ORDER — OXYCODONE-ACETAMINOPHEN 5-325 MG PO TABS: 1.0000 | ORAL_TABLET | ORAL | 0 refills | Status: AC | PRN

## 2021-11-12 NOTE — MAU Provider Note (Signed)
History     CSN: [id number]  Arrival date and time: 11/12/21 4259   Event Date/Time   First Provider Initiated Contact with Patient 11/12/21 [phone]      Chief Complaint  Patient presents with   Abdominal Pain   HPI [name] is a 21 y.o. year old G88P0 female at [redacted]w[redacted]d weeks gestation who presents to MAU reporting upper abdominal pain that radiates to her back. She reports her abdomen is hardened and feels more hard when she eats or is sleeping. She reports the pain increased at midnight. This problems has been going on for 10 days. She denies contractions, but describes the pain as a "tightening pain." She was recently d/c'd from [facility] for this same problem. The dx she was given was hyperreactio luteinalis and small ascites. She was advised to take her medications. She was advised to return to [facility], if the pain is not any better after taking her medications. She has not picked up her medications, because her husband states that [facility] said they had no Rx for any of the medications on her d/c paperwork. She receives [facility]; next appt is 12/06/2021. Her spouse is present and contributing to the history taking.   OB History     Gravida  1   Para      Term      Preterm      AB      Living         SAB      IAB      Ectopic      Multiple      Live Births              Past Medical History:  Diagnosis Date   Encounter for supervision of normal first pregnancy in second trimester 10/12/2021    Nursing Staff Provider Office Location Dating  03/26/2022, by Ultrasound [facility] Model [ ]  Traditional [ ]  Centering [ ]  Mom-Baby Dyad Anatomy US   Language     Flu Vaccine   Genetic/Carrier Screen  NIPS:    AFP:    [place]: TDaP Vaccine    Hgb A1C or  GTT Early  Third trimester  COVID Vaccine    LAB RESULTS  Rhogam  --/--/O POS Performed at [facility] history non-contributory     Past Surgical History:  Procedure Laterality Date   NO PAST  SURGERIES      Family History  Problem Relation Age of Onset   Healthy Mother    Diabetes Father    Diabetes Paternal Grandmother    Colon cancer Neg Hx    Breast cancer Neg Hx    Ovarian cancer Neg Hx    Endometrial cancer Neg Hx    Pancreatic cancer Neg Hx    Prostate cancer Neg Hx     Social History   Tobacco Use   Smoking status: Never    Passive exposure: Never   Smokeless tobacco: Never  Vaping Use   Vaping Use: Never used  Substance Use Topics   Alcohol use: Never   Drug use: Never    Allergies: No Known Allergies  Medications Prior to Admission  Medication Sig Dispense Refill Last Dose   docusate sodium (COLACE) 100 MG capsule Take 1 capsule (100 mg total) by mouth 2 (two) times daily. 60 capsule 0    oxyCODONE-acetaminophen (PERCOCET/ROXICET) 5-325 MG tablet Take 1 tablet by mouth every 6 (six) hours as needed for moderate  pain or severe pain. 30 tablet 0    polyethylene glycol (MIRALAX / GLYCOLAX) 17 g packet Take 17 g by mouth daily as needed for moderate constipation or severe constipation. 14 each 2    Prenatal MV & Min w/FA-DHA (PRENATAL GUMMIES PO) Take by mouth.       Review of Systems  Constitutional: Negative.   HENT: Negative.    Eyes: Negative.   Respiratory: Negative.    Cardiovascular: Negative.   Gastrointestinal:  Positive for abdominal pain (upper, "tightening pain").  Endocrine: Negative.   Genitourinary: Negative.   Musculoskeletal: Negative.   Skin: Negative.   Allergic/Immunologic: Negative.   Neurological: Negative.   Hematological: Negative.   Psychiatric/Behavioral: Negative.     Physical Exam   Blood pressure 128/78, pulse 90, temperature 98.6 F (37 C), temperature source Oral, resp. rate 14, height 5\' 1"  (1.549 m), weight 74.9 kg, last menstrual period 06/13/2021, SpO2 100 %.  Physical Exam Vitals and nursing note reviewed. Exam conducted with a chaperone present.  Constitutional:      Appearance: Normal appearance. She  is normal weight.  Cardiovascular:     Rate and Rhythm: Normal rate.  Pulmonary:     Effort: Pulmonary effort is normal.  Abdominal:     General: Bowel sounds are normal. There is distension.     Palpations: Abdomen is rigid.     Tenderness: There is abdominal tenderness in the right upper quadrant and left upper quadrant.  Neurological:     Mental Status: She is alert and oriented to person, place, and time.  Psychiatric:        Mood and Affect: Mood normal.        Behavior: Behavior normal.        Thought Content: Thought content normal.        Judgment: Judgment normal.    FHTs by doppler: 153 bpm  MAU Course  Procedures  MDM Percocet 5/325 mg 2 tablets  *Consult with Dr. 06/15/2021 @ [phone] - notified of patient's complaints, assessments - recommended tx plan give pain medication and will have Dr. [id number] come see her after 0800 today  Report given to Macon Large, CNM who will hand-off patient to Dr. Camelia Eng, CNM 11/12/2021, 7:10 AM  Assessment and Plan

## 2021-11-12 NOTE — MAU Note (Signed)
..  Eileen Johnson is a 21 y.o. at [redacted]w[redacted]d here in MAU reporting: upper abdominal pain that radiates to her back that feels like it hardens and she feels it more when she eats or sleeps, she has had this pain for 10 days. Denies vaginal bleeding or leaking of fluid.  Reports that the pain is not like contractions but it does feel like a tightening pain.  Has not felt movement yet.   Pain score: 0/10 (when the pain comes its 10/10) Vitals:   11/12/21 0643 11/12/21 0649  BP: 128/78   Pulse: 90   Resp: 14   Temp: 98.6 F (37 C)   SpO2:  100%     [id number]  Lab orders placed from triage:  UA

## 2021-11-13 ENCOUNTER — Other Ambulatory Visit (HOSPITAL_BASED_OUTPATIENT_CLINIC_OR_DEPARTMENT_OTHER): Payer: Self-pay | Admitting: Advanced Practice Midwife

## 2021-11-13 DIAGNOSIS — A749 Chlamydial infection, unspecified: Secondary | ICD-10-CM

## 2021-11-13 MED ORDER — AZITHROMYCIN 500 MG PO TABS: 1000.0000 mg | ORAL_TABLET | Freq: Once | ORAL | 1 refills | Status: AC

## 2021-11-14 LAB — TESTOSTERONE, FREE: Testosterone, Free: >50 pg/mL — ABNORMAL HIGH (ref 0.0–4.2)

## 2021-11-15 ENCOUNTER — Inpatient Hospital Stay (HOSPITAL_COMMUNITY)
Admission: AD | Admit: 2021-11-15 | Discharge: 2021-11-15 | Disposition: A | Discharge status: Home / Self Care | Payer: Medicaid Other | Attending: Family Medicine | Admitting: Family Medicine

## 2021-11-15 ENCOUNTER — Other Ambulatory Visit: Payer: Self-pay

## 2021-11-15 DIAGNOSIS — Z79899 Other long term (current) drug therapy: Secondary | ICD-10-CM | POA: Diagnosis not present

## 2021-11-15 DIAGNOSIS — Z3A21 21 weeks gestation of pregnancy: Secondary | ICD-10-CM | POA: Diagnosis not present

## 2021-11-15 DIAGNOSIS — O0992 Supervision of high risk pregnancy, unspecified, second trimester: Secondary | ICD-10-CM | POA: Insufficient documentation

## 2021-11-15 DIAGNOSIS — N9089 Other specified noninflammatory disorders of vulva and perineum: Secondary | ICD-10-CM

## 2021-11-15 DIAGNOSIS — O1202 Gestational edema, second trimester: Secondary | ICD-10-CM | POA: Diagnosis not present

## 2021-11-15 DIAGNOSIS — R600 Localized edema: Secondary | ICD-10-CM

## 2021-11-15 DIAGNOSIS — O0990 Supervision of high risk pregnancy, unspecified, unspecified trimester: Secondary | ICD-10-CM

## 2021-11-15 MED ORDER — FUROSEMIDE 20 MG PO TABS: 20.0000 mg | ORAL_TABLET | Freq: Every day | ORAL | 11 refills | Status: AC

## 2021-11-15 NOTE — MAU Note (Signed)
Eileen Johnson is a 21 y.o. at [redacted]w[redacted]d here in MAU reporting: swelling in vaginal and feet since Friday after taking pain medicine.  Reports had vaginal irritation after removal of foley catheter last week, but swelling worse in vaginal area now that she's taking pain meds.  Denies VB or LOF. LMP: N/A Onset of complaint: Friday Pain score: 0 There were no vitals filed for this visit.   FHT:153 bpm Lab orders placed from triage:   None

## 2021-11-15 NOTE — MAU Provider Note (Signed)
History     CSN: [id number]  Arrival date and time: 11/15/21 0803   None     Chief Complaint  Patient presents with   Swelling in Feet   HPI This is a 21 year old G1, P0 at 21 weeks and 2 days who is pregnancy is complicated by hyper reaction luteinalis extremely enlarged ovaries.  Over the past 3 days, she patient's had increasing swelling in her lower extremities bilaterally pain in her vulva.  These have been steadily increasing.  No palliating provoking factors.  Legs feel tight and are uncomfortable when she is walking around.  OB History     Gravida  1   Para      Term      Preterm      AB      Living         SAB      IAB      Ectopic      Multiple      Live Births                Past Surgical History:  Procedure Laterality Date   NO PAST SURGERIES      Family History  Problem Relation Age of Onset   Healthy Mother    Diabetes Father    Diabetes Paternal Grandmother    Colon cancer Neg Hx    Breast cancer Neg Hx    Ovarian cancer Neg Hx    Endometrial cancer Neg Hx    Pancreatic cancer Neg Hx    Prostate cancer Neg Hx     Social History   Tobacco Use   Smoking status: Never    Passive exposure: Never   Smokeless tobacco: Never  Vaping Use   Vaping Use: Never used  Substance Use Topics   Alcohol use: Never   Drug use: Never    Allergies: No Known Allergies  Medications Prior to Admission  Medication Sig Dispense Refill Last Dose   docusate sodium (COLACE) 100 MG capsule Take 1 capsule (100 mg total) by mouth 2 (two) times daily. 60 capsule 0 11/14/2021   ibuprofen (ADVIL) 800 MG tablet Take 1 tablet (800 mg total) by mouth 3 (three) times daily with meals for 3 days. 9 tablet 0 11/14/2021   oxyCODONE-acetaminophen (PERCOCET/ROXICET) 5-325 MG tablet Take 1 tablet by mouth every 4 (four) hours as needed for up to 5 days for moderate pain or severe pain. 30 tablet 0 11/14/2021   polyethylene glycol (MIRALAX / GLYCOLAX) 17 g  packet Take 17 g by mouth daily as needed for moderate constipation or severe constipation. 14 each 2 11/14/2021   Prenatal MV & Min w/FA-DHA (PRENATAL GUMMIES PO) Take by mouth.   11/14/2021    Review of Systems Physical Exam   Blood pressure 132/82, pulse (!) 110, temperature 98.4 F (36.9 C), temperature source Oral, resp. rate 18, weight 76.6 kg, last menstrual period 06/13/2021, SpO2 99 %.  Physical Exam Vitals reviewed. Exam conducted with a chaperone present.  Constitutional:      Appearance: Normal appearance.  Genitourinary:    Comments: Swelling in her vulva bilaterally, right greater than left.  The edema is continues onto her mons pubis. Skin:    General: Skin is warm and dry.     Capillary Refill: Capillary refill takes less than 2 seconds.     Comments: 2+ pitting edema in the lower extremities bilaterally  Neurological:     General: No focal deficit present.  Mental Status: She is alert.  Psychiatric:        Mood and Affect: Mood normal.        Behavior: Behavior normal.        Thought Content: Thought content normal.        Judgment: Judgment normal.     MAU Course  Procedures  MDM  Assessment and Plan   1. Supervision of high risk pregnancy, antepartum   2. [redacted] weeks gestation of pregnancy   3. Lower extremity edema   4. Vulvar edema    Discharge to home on Lasix 20 mg daily.  Discussed that this will make her urinate quite frequently for 6 hours after taking.  Hopefully this will help with some of the edema.  Compression stockings for the lower extremity edema.  While we do not normally drain these types of cysts that the patient has, we will reach out to MFM to see if there is anything else.  Truett Mainland 11/15/2021, 8:56 AM

## 2021-11-17 ENCOUNTER — Encounter (HOSPITAL_COMMUNITY): Payer: Self-pay

## 2021-11-17 ENCOUNTER — Telehealth: Payer: Self-pay

## 2021-11-17 ENCOUNTER — Other Ambulatory Visit (HOSPITAL_COMMUNITY): Payer: Medicaid Other

## 2021-11-17 ENCOUNTER — Ambulatory Visit (HOSPITAL_COMMUNITY): Admission: RE | Admit: 2021-11-17 | Payer: Medicaid Other | Source: Ambulatory Visit

## 2021-11-17 NOTE — Telephone Encounter (Signed)
S/w pt via Spanish interpreter and advised pt that provider sent in treatment for +CT again for her and her partner since test of cure showed positive results on 10/24. Pt voiced understanding.

## 2021-11-25 ENCOUNTER — Ambulatory Visit: Payer: Medicaid Other | Attending: Maternal & Fetal Medicine

## 2021-11-25 ENCOUNTER — Ambulatory Visit: Payer: Medicaid Other

## 2021-12-07 ENCOUNTER — Encounter: Payer: Medicaid Other | Admitting: Obstetrics and Gynecology

## 2021-12-23 ENCOUNTER — Telehealth: Payer: Self-pay | Admitting: Obstetrics and Gynecology

## 2021-12-23 ENCOUNTER — Ambulatory Visit: Payer: Medicaid Other

## 2021-12-23 ENCOUNTER — Ambulatory Visit: Payer: Medicaid Other | Attending: Maternal & Fetal Medicine

## 2021-12-23 NOTE — Telephone Encounter (Signed)
Telephone call to patient using [employer].  Patient did not answer and her voicemail is full.   Patient has missed her last two OB visits and we have tried to reach her to reschedule x 2.   I have messaged MFM whom she has appointment with today to try to relay message to patient.  Will continue to try to reach her for scheduling.

## 2022-01-27 NOTE — Unmapped External Note (Signed)
 Operative Note  Eileen Johnson   [id number]  Date of Procedure: 01/27/2022  1:32 PM  Pre-op Diagnosis:  Large bilateral abdominopelvic masses Elevated tumor markers  Procedure:  Exploratory laparotomy Drainage and resection of bilateral ovarian masses Bilateral ovarian cystectomy with bilateral ovarian reconstruction     Surgeon: Surgeon(s) and Role:    DEWAINE Court Eileen Comer, MD - Primary    * McGough, Eileen Longs, MD - Fellow    * Eileen Lavanda Marseille, MD - Resident    * Eileen Laurance Stabs, MD - Resident  Type of Anesthesia: Regional with General  EBL: 100cc 12 liters of cyst fluid drained IVF: 3000cc UOP: 500cc  Drains/Packing:  Urethral Catheter 01/27/22 1655 Non-latex;Straight-tip 16 Fr. (Active)   Findings:  Edematous appearing subcutaneous tissue on entry. Large, thin walled, simple appearing multi-septated masses arising from bilateral ovaries - total fluid drained was 12 liters - serous appearing. After drainage of multiple liters of cystic fluid, we were able to identify bilateral adnexal masses (right greater than left) with some normal appearing ovarian tissue and normal appearing fallopian tubes and uterus. No peritoneal disease, normal appendix and bowel survey. After ovarian reconstruction, the left ovary was approximately 5 cm and the right ovary was approximately 15 cm. Intraoperative frozen section on right ovarian cyst wall was negative for malignancy.  Specimens:  ID Type Source Tests Collected by Time  1 : pelvic washings OR17 PM/SN Body Fluid Body Fluid CYTOLOGY, NON-GYNECOLOGICAL Court Eileen Comer, MD 01/27/2022 1723  2 : portion right ovarian mass  OR17 PM/SN Tissue Ovary, Right TISSUE EXAM Court Eileen Comer, MD 01/27/2022 1749  3 : right ovarian cyst wall OR17 PM/SN Tissue Tissue TISSUE EXAM Court Eileen Comer, MD 01/27/2022 1805  4 : left ovarian mass OR 17 PM/SN MH/WS Tissue Tissue TISSUE Eileen Johnson Court Eileen Comer, MD  01/27/2022 1821  5 : right ovarian mass OR 17 PM/SN MH/WS Tissue Tissue TISSUE Eileen Johnson Court Eileen Comer, MD 01/27/2022 1826   Description of Procedure:  The patient was taken to the operating room where general endotracheal anesthesia was established. Next, the patient was placed in dorsal lithotomy position with her legs placed in padded, yellofin stirrups. A surgical time out was performed. The patient was then prepped and draped in normal sterile fashion. A foley catheter was inserted aseptically. Cefoxitin was completed prior to skin incision. SCDs were turned on prior to induction of anesthesia and 5000 units of subcutaneous heparin was given preoperatively. A midline vertical skin incision was made with a scalpel. This was carried through the layers of the anterior abdominal wall to gain access to the abdominal cavity. The peritoneum was entered and the peritoneal incision was extended superiorly and inferiorly. Abdominal pelvic exploration was performed with the above-mentioned findings. Abdominal pelvic washings were performed.  Large, thin walled, simple appearing multi-septated mass noted. Given simple appearing cysts and mass extending up to level of her xyphoid, the decision was made to drain the multiple septations to allow recognition of adnexal structures. The multiple septations were punctured using a scalpel and drained. After drainage, total of 12 liters of serous fluid with scant amounts of mucin, bilateral tubes and ovaries were noted. Some normal appearing ovarian tissue was able to be preserved bilaterally and cyst wall was removed from the right sided mass and sent for frozen. This resulted as benign on intraoperative frozen section, thus the decision was made for ovarian preservation. A portion of both right and left sided cyst walls were sent for permanent section.  Hemostasis was achieved using bovie cautery, suture and Surgicel powder in bilateral ovaries and they were  reconstructed using a mix of running and interrupted 3-0 Vicryl to reapproximate ovarian tissue. Surgicel sheets were then wrapped around the right ovary.   The pelvis was irrigated and additional hemostasis was achieved. We turned to closing at this point. All lap pads and retractors were removed. The anterior abdominal wall was closed in an en masse fashion using two running #1 looped PDS sutures. These sutures were secured in the midline. Scarpa's layer was copiously irrigated and the skin was closed with interrupted 3-0 Vicryl and running subcuticular 4-0 Monocryl followed by Dermabond. The patient was reversed from anesthesia and transferred to recovery room in satisfactory condition. All sponge, lap, needle counts correct times three.   Eileen Johnson, M.D. Gynecologic Oncology Fellow, PGY-5   Electronically signed by: Johnson Eileen Longs, MD 01/27/22 2000  I was present for the Key/Critical Components of this procedure. I have amended the operative dication to reflect the surgical procedures performed. I personally updated the patient's significant other with the use of a professional interpreter at the conclusion of the procedure.  Electronically Signed by: Eileen Eileen Lesches, MD, Attending Physician 01/28/2022 5:01 PM    Electronically signed by: Johnson Eileen Crank, MD 01/28/22 1704

## 2022-03-09 ENCOUNTER — Ambulatory Visit: Payer: Commercial Managed Care - PPO | Attending: Obstetrics and Gynecology

## 2022-10-17 ENCOUNTER — Ambulatory Visit
Admission: EM | Admit: 2022-10-17 | Discharge: 2022-10-17 | Disposition: A | Discharge status: Home / Self Care | Payer: Medicaid Other | Attending: Internal Medicine | Admitting: Internal Medicine

## 2022-10-17 ENCOUNTER — Other Ambulatory Visit: Payer: Self-pay

## 2022-10-17 ENCOUNTER — Encounter: Payer: Self-pay | Admitting: *Deleted

## 2022-10-17 DIAGNOSIS — R109 Unspecified abdominal pain: Secondary | ICD-10-CM | POA: Diagnosis not present

## 2022-10-17 DIAGNOSIS — Z113 Encounter for screening for infections with a predominantly sexual mode of transmission: Secondary | ICD-10-CM | POA: Diagnosis not present

## 2022-10-17 DIAGNOSIS — G8929 Other chronic pain: Secondary | ICD-10-CM | POA: Insufficient documentation

## 2022-10-17 LAB — POCT URINALYSIS DIP (MANUAL ENTRY)
Bilirubin, UA: NEGATIVE
Glucose, UA: NEGATIVE mg/dL
Ketones, POC UA: NEGATIVE mg/dL
Leukocytes, UA: NEGATIVE
Nitrite, UA: NEGATIVE
Protein Ur, POC: NEGATIVE mg/dL
Spec Grav, UA: 1.025 (ref 1.010–1.025)
Urobilinogen, UA: 0.2 U/dL
pH, UA: 6 (ref 5.0–8.0)

## 2022-10-17 LAB — POCT URINE PREGNANCY: Preg Test, Ur: NEGATIVE

## 2022-10-17 NOTE — ED Triage Notes (Signed)
Pt had surgery a couple months ago for a cyst on her left ovary. C/o having abd pain and swelling x 1 month. Pain is in upper abdomen and LLQ

## 2022-10-17 NOTE — ED Provider Notes (Signed)
[facility]    CSN: [id number] Arrival date & time: 10/17/22  0827      History   Chief Complaint Chief Complaint  Patient presents with   Abdominal Pain    HPI Eileen Johnson is a 22 y.o. female.   Patient presents with abdominal pain that has been intermittent since January.  Patient had exploratory laparotomy in January where ovarian cysts were drained and she also had a D&C.  Patient states abdominal pain has been intermittent ever since this procedure and has been fairly unchanged.  Reports it mainly occurs in the left lower quadrant of the abdomen but does radiate up to the upper part of the abdomen.  She has been seen by gynecology in August who performed an ultrasound that showed a cyst on the left ovary.  She does not have any follow-up appointment with gynecology.  Denies nausea, vomiting, diarrhea.  Last bowel movement was yesterday was normal.  She denies dysuria, abnormal vaginal bleeding, vaginal discharge.  States that she last had a menstrual cycle prior to Nexplanon being placed in August.  She states that she does use protection with sexual intercourse.  Denies exposure to STD.  Denies any associated fevers.  She describes it as a burning and stabbing pain.   Abdominal Pain     Patient Active Problem List   Diagnosis Date Noted   Abdominal pain during pregnancy, second trimester 11/10/2021   Elevated AFP 11/09/2021   Bilateral Large Adnexal Masses *RED CHART* 11/09/2021   Abdominal pain affecting pregnancy 11/09/2021   Supervision of high risk pregnancy, antepartum 10/12/2021   Chlamydia infection affecting pregnancy in second trimester 10/02/2021    Past Surgical History:  Procedure Laterality Date   NO PAST SURGERIES     OVARIAN CYST SURGERY Left     OB History     Gravida  1   Para      Term      Preterm      AB      Living         SAB      IAB      Ectopic      Multiple      Live Births                Home Medications    Prior to Admission medications   Medication Sig Start Date End Date Taking? Authorizing Provider  docusate sodium (COLACE) 100 MG capsule Take 1 capsule (100 mg total) by mouth 2 (two) times daily. 11/10/21   Anyanwu, Jethro Bastos, MD  furosemide (LASIX) 20 MG tablet Take 1 tablet (20 mg total) by mouth daily. 11/15/21 11/15/22  Levie Heritage, DO  polyethylene glycol (MIRALAX / GLYCOLAX) 17 g packet Take 17 g by mouth daily as needed for moderate constipation or severe constipation. 11/10/21   Anyanwu, Jethro Bastos, MD  Prenatal MV & Min w/FA-DHA (PRENATAL GUMMIES PO) Take by mouth.    [provider]    Family History Family History  Problem Relation Age of Onset   Healthy Mother    Diabetes Father    Diabetes Paternal Grandmother    Colon cancer Neg Hx    Breast cancer Neg Hx    Ovarian cancer Neg Hx    Endometrial cancer Neg Hx    Pancreatic cancer Neg Hx    Prostate cancer Neg Hx     Social History Social History   Tobacco Use   Smoking status: Never  Passive exposure: Never   Smokeless tobacco: Never  Vaping Use   Vaping status: Never Used  Substance Use Topics   Alcohol use: Never   Drug use: Never     Allergies   Patient has no known allergies.   Review of Systems Review of Systems Per HPI  Physical Exam Triage Vital Signs ED Triage Vitals  Encounter Vitals Group     BP 10/17/22 1012 106/68     Systolic BP Percentile --      Diastolic BP Percentile --      Pulse Rate 10/17/22 1012 70     Resp 10/17/22 1012 16     Temp 10/17/22 1012 98.5 F (36.9 C)     Temp Source 10/17/22 1012 Oral     SpO2 10/17/22 1012 98 %     Weight --      Height --      Head Circumference --      Peak Flow --      Pain Score 10/17/22 1013 6     Pain Loc --      Pain Education --      Exclude from Growth Chart --    No data found.  Updated Vital Signs BP 106/68 (BP Location: Left Arm)   Pulse 70   Temp 98.5 F (36.9 C) (Oral)    Resp 16   LMP  (LMP Unknown)   SpO2 98%   Breastfeeding No   Visual Acuity Right Eye Distance:   Left Eye Distance:   Bilateral Distance:    Right Eye Near:   Left Eye Near:    Bilateral Near:     Physical Exam Constitutional:      General: She is not in acute distress.    Appearance: Normal appearance. She is not toxic-appearing or diaphoretic.  HENT:     Head: Normocephalic and atraumatic.  Eyes:     Extraocular Movements: Extraocular movements intact.     Conjunctiva/sclera: Conjunctivae normal.  Cardiovascular:     Rate and Rhythm: Normal rate and regular rhythm.     Pulses: Normal pulses.     Heart sounds: Normal heart sounds.  Pulmonary:     Effort: Pulmonary effort is normal. No respiratory distress.     Breath sounds: Normal breath sounds.  Abdominal:     General: Bowel sounds are normal. There is no distension.     Palpations: Abdomen is soft.     Tenderness: There is no abdominal tenderness.  Genitourinary:    Comments: Deferred with shared decision making. Self swab performed.  Neurological:     General: No focal deficit present.     Mental Status: She is alert and oriented to person, place, and time. Mental status is at baseline.  Psychiatric:        Mood and Affect: Mood normal.        Behavior: Behavior normal.        Thought Content: Thought content normal.        Judgment: Judgment normal.      UC Treatments / Results  Labs (all labs ordered are listed, but only abnormal results are displayed) Labs Reviewed  POCT URINALYSIS DIP (MANUAL ENTRY) - Abnormal; Notable for the following components:      Result Value   Blood, UA trace-intact (*)    All other components within normal limits  POCT URINE PREGNANCY  CERVICOVAGINAL ANCILLARY ONLY    EKG   Radiology No results found.  Procedures Procedures (including critical  care time)  Medications Ordered in UC Medications - No data to display  Initial Impression / Assessment and Plan / UC  Course  I have reviewed the triage vital signs and the nursing notes.  Pertinent labs & imaging results that were available during my care of the patient were reviewed by me and considered in my medical decision making (see chart for details).     Patient presenting with intermittent abdominal pain that has been present since January after surgery.  Patient is not exhibiting any signs of acute abdomen or dehydration that would warrant emergent evaluation or imaging at this time.  I do think the patient needs follow-up with gynecology given that recent surgery versus adhesions/inflammation from surgery versus ovarian cyst could be contributing to pain.  Discussed that she needs to follow-up with gynecology and/or PCP as soon as possible for further evaluation and management as she most likely needs specialty evaluation.  Do not have any imaging capabilities here in urgent care at this time for evaluation and discussed this with patient.  I did obtain UA which was unremarkable except for red blood cells so encouraged patient to have this rechecked to ensure red blood cells have been cleared as well.  Urine pregnancy test completed that was negative.  Cervicovaginal swab pending.  Awaiting results.  Advised strict ER precautions as well.  Patient verbalized understanding and was agreeable with plan.  Interpreter used throughout patient reaction. Final Clinical Impressions(s) / UC Diagnoses   Final diagnoses:  Chronic abdominal pain  Screening examination for venereal disease     Discharge Instructions      Please follow-up with gynecologist as soon as possible for further evaluation and management.  Go to the emergency department if symptoms persist or worsen.    ED Prescriptions   None    PDMP not reviewed this encounter.   Gustavus Bryant, [place] 10/17/22 1055

## 2022-10-17 NOTE — Discharge Instructions (Signed)
Please follow-up with gynecologist as soon as possible for further evaluation and management.  Go to the emergency department if symptoms persist or worsen.

## 2022-10-18 LAB — CERVICOVAGINAL ANCILLARY ONLY
Bacterial Vaginitis (gardnerella): NEGATIVE
Candida Glabrata: NEGATIVE
Candida Vaginitis: NEGATIVE
Chlamydia: NEGATIVE
Comment: NEGATIVE
Comment: NEGATIVE
Comment: NEGATIVE
Comment: NEGATIVE
Comment: NEGATIVE
Comment: NORMAL
Neisseria Gonorrhea: NEGATIVE
Trichomonas: NEGATIVE

## 2023-07-04 IMAGING — CR DG CHEST 2V
2 series · 2 of 2 positions shown · non-contrast
Comparison: 09/15/2020

CLINICAL DATA: Cough, body aches

EXAM:
CHEST - 2 VIEW

[chest lat]
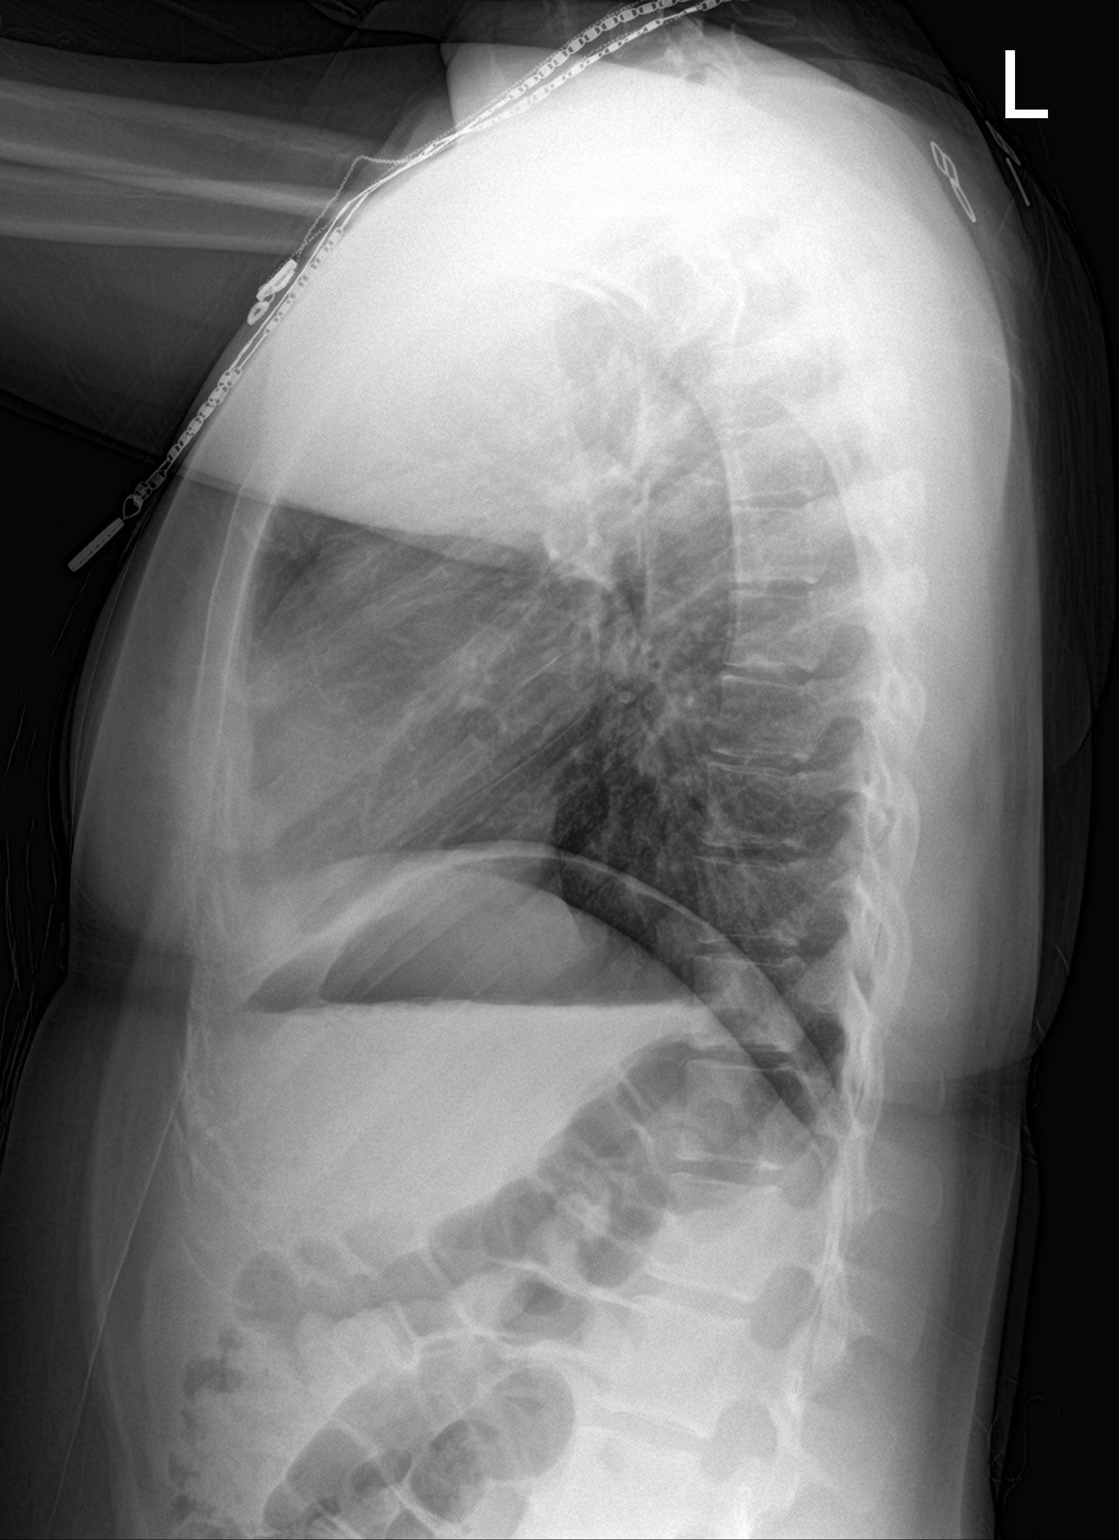

[chest pa]
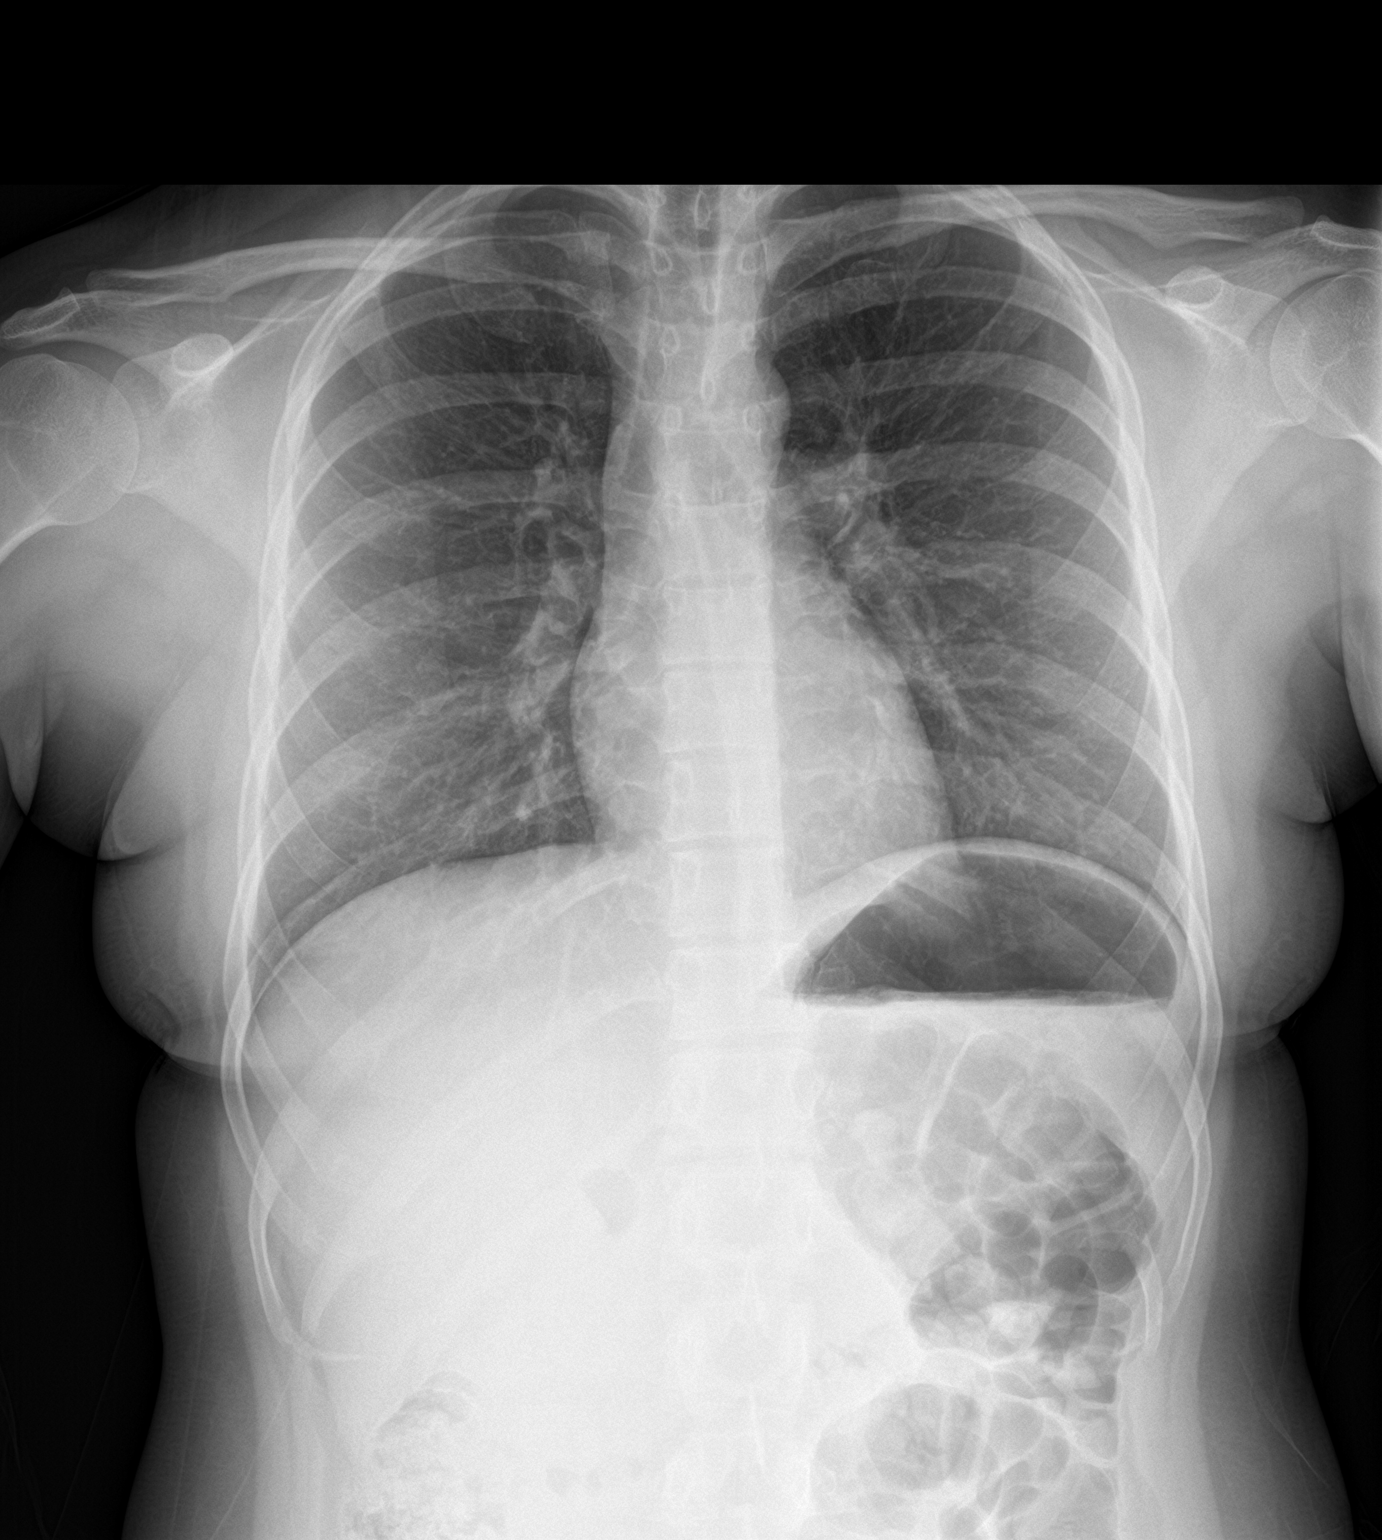

[2 of 2 positions shown; findings below may reference images not displayed]

FINDINGS: The heart size and mediastinal contours are within normal limits.
Both lungs are clear. The visualized skeletal structures are
unremarkable.
IMPRESSION: Normal study
